# Patient Record
Sex: Male | Born: 1960 | Race: White | Hispanic: No | Marital: Married | Smoking: Current every day smoker
Health system: Southern US, Community
[De-identification: ages and names within clinical notes are randomized; demographics above are authoritative.]

## PROBLEM LIST (undated history)

## (undated) DIAGNOSIS — J449 Chronic obstructive pulmonary disease, unspecified: Secondary | ICD-10-CM

## (undated) DIAGNOSIS — G43909 Migraine, unspecified, not intractable, without status migrainosus: Secondary | ICD-10-CM

## (undated) DIAGNOSIS — F419 Anxiety disorder, unspecified: Secondary | ICD-10-CM

## (undated) DIAGNOSIS — S46812A Strain of other muscles, fascia and tendons at shoulder and upper arm level, left arm, initial encounter: Secondary | ICD-10-CM

## (undated) DIAGNOSIS — K219 Gastro-esophageal reflux disease without esophagitis: Secondary | ICD-10-CM

## (undated) DIAGNOSIS — M1990 Unspecified osteoarthritis, unspecified site: Secondary | ICD-10-CM

## (undated) DIAGNOSIS — I10 Essential (primary) hypertension: Secondary | ICD-10-CM

## (undated) DIAGNOSIS — Z9989 Dependence on other enabling machines and devices: Secondary | ICD-10-CM

## (undated) DIAGNOSIS — F32A Depression, unspecified: Secondary | ICD-10-CM

## (undated) DIAGNOSIS — G4730 Sleep apnea, unspecified: Secondary | ICD-10-CM

## (undated) HISTORY — PX: JOINT REPLACEMENT: SHX530

## (undated) HISTORY — DX: Gastro-esophageal reflux disease without esophagitis: K21.9

## (undated) HISTORY — DX: Migraine, unspecified, not intractable, without status migrainosus: G43.909

## (undated) HISTORY — DX: Dependence on other enabling machines and devices: Z99.89

## (undated) HISTORY — DX: Depression, unspecified: F32.A

## (undated) HISTORY — DX: Chronic obstructive pulmonary disease, unspecified: J44.9

## (undated) HISTORY — PX: OTHER SURGICAL HISTORY: SHX169

## (undated) HISTORY — DX: Anxiety disorder, unspecified: F41.9

## (undated) HISTORY — DX: Unspecified osteoarthritis, unspecified site: M19.90

---
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
## 1994-01-23 DIAGNOSIS — G4730 Sleep apnea, unspecified: Secondary | ICD-10-CM

## 1994-01-23 HISTORY — PX: OTHER SURGICAL HISTORY: SHX169

## 1994-01-23 HISTORY — DX: Sleep apnea, unspecified: G47.30

## 1997-05-14 ENCOUNTER — Ambulatory Visit: Admission: RE | Admit: 1997-05-14 | Discharge: 1997-05-14 | Payer: Self-pay

## 2005-08-07 ENCOUNTER — Ambulatory Visit: Payer: Self-pay | Admitting: Cardiology

## 2005-08-07 ENCOUNTER — Observation Stay (HOSPITAL_COMMUNITY): Admission: EM | Admit: 2005-08-07 | Discharge: 2005-08-08 | Payer: Self-pay | Admitting: Cardiology

## 2006-12-11 ENCOUNTER — Ambulatory Visit: Payer: Self-pay | Admitting: Cardiology

## 2008-01-24 HISTORY — PX: CARDIAC CATHETERIZATION: SHX172

## 2010-06-07 NOTE — Assessment & Plan Note (Signed)
[facility]                          [facility] NOTE   NAME:[name], [name] KITCHINGS                    MRN:          [id number]  DATE:12/11/2006                            DOB:          22-Mar-1960    CARDIOLOGIST:  Learta Codding, MD,FACC   PRIMARY CARE PHYSICIAN:  None.  He goes to [facility] in [place] with  health issues.   REASON FOR VISIT:  A 7-month followup.   HISTORY OF PRESENT ILLNESS:  [name] [name] is a 50 year old male patient  initially seen by Dr. Simona Huh in July 2007 at [facility]  secondary to chest discomfort and new onset pedal edema.  The patient  was apparently told that he had congestive heart failure and was placed  on Lasix.  The patient underwent a stress Cardiolite which was abnormal.  He also underwent an echocardiogram.  His ejection fraction was normal  with an EF of 55-60%, and he also had normal diastolic function.  He had  no significant valvular abnormalities.  His stress test was abnormal and  he was transferred to [facility] for cardiac catheterization.  This revealed minimal cardiac plaquing.  Specifically, he had 20% distal  LAD stenosis and 20% distal circumflex stenosis and no significant  disease in RCA.  He was lost to followup and recently contacted our  office for a follow up visit.  He returns today for follow up.   The patient notes that he has had no chest pain or dyspnea on exertion.  He describes NYHA class I-II symptoms.  Denies any orthopnea, PND or  pedal edema.  He recently came off of the Lasix and has had no  recurrence of his edema.  He does have sleep apnea, and he is currently  noncompliant with CPAP.  He does, however, note some neck pain off and  on for the last three months.  It is left sided and changes with  positioning changes.  He does have bilateral hand numbness that is  unrelated.  His right is worse than his left.  His numbness is in the  distribution of  the median nerve bilaterally.   In reviewing his history, he had sudden onset of pedal edema a little  over a year ago that prompted his extensive workup.  He had no other  symptoms of congestive heart failure.  He was apparently told he had  edema on a chest x-ray.  However, he never really had significant  dyspnea.  Based upon his normal findings on his echocardiogram and his  heart catheterization, congestive heart failure seems to be an unlikely  diagnosis.   The patient does note quite a bit of fatigue.  He has been noncompliant  with his CPAP.  His wife notes that he still snores and still has  witnessed apneic episodes.   CURRENT MEDICATIONS:  He is currently out of aspirin, Lasix and is not  using a CPAP.   ALLERGIES:  HYDROCODONE.   SOCIAL HISTORY:  The patient smokes between one and one half packs of  cigarettes per day.   FAMILY HISTORY:  Insignificant for premature coronary artery disease.   PHYSICAL EXAMINATION:  GENERAL:  He is a well-developed, well-nourished  male in no distress.  VITAL SIGNS:  Blood pressure 120/78, pulse 81, weight 190.4 pounds .  HEENT:  Normal.  NECK:  Without JVD, lymphadenopathy.  ENDOCRINE:  Without thyromegaly.  CARDIAC:  Normal S1, S2.  Regular rate and rhythm without murmurs.  LUNGS:  Clear to auscultation bilaterally without wheezes, rales or  rhonchi.  ABDOMEN:  Soft, nontender with normoactive bowel sounds.  No  organomegaly  EXTREMITIES:  Without edema.  Calves are soft and nontender.  SKIN:  Warm and dry.  NEUROLOGICAL:  Alert and oriented x3.  Cranial nerves II-XII are grossly  intact.  VASCULAR:  Right femoral arteriotomy site without hematoma or bruit.  Dorsalis pedis and posterior tibialis pulses are 2+ bilaterally.   STUDIES:  Electrocardiogram reveals sinus rhythm with a heart rate of  66, normal axis, no acute changes.   IMPRESSION:  1. Minimal coronary plaquing by cardiac catheterization July 2007.  2. Good LV  function with normal diastolic function by echocardiogram.  3. Tobacco abuse.  4. Sleep apnea.  CPAP noncompliant.  5. Questionable history of hypertension.  The patient is normotensive      today.  6. Past history of transient pedal edema - etiology unclear.  Suspect      venous insufficiency.  7. Neck pain.  8. Probable bilateral carpal tunnel syndrome.   PLAN:  As noted above, the patient returns to the office today for  follow up.  The exact etiology of his pedal edema a year and a half ago  is unclear.  I do not think he had congestive heart failure.  His  diastolic function was normal on echocardiogram and systolic function  was also normal.  He has been quite concerned over the last year and a  half about his cardiac diagnoses.  I have reassured him today that he is  doing well.  He is noncompliant with his CPAP, and I have encouraged him  to continue with this.  I have also encouraged him to quit smoking.  He  does need to establish with a primary care physician.  His neck pain is  probably musculoskeletal.   At this point in time, I have recommended:  1. Discontinue Lasix.  2. Continue on aspirin 81 mg daily for primary prevention.  3. Will arrange lipid evaluation.  We will also check other laboratory      data because of his history of fatigue.  This will include a C-met,      CBC and TSH.  4. Will also set him up for cervical spine x-ray to rule out acute      pathology.  I have encouraged him to follow with the primary care      physician further for this.  I have also recommended Motrin over-      the-counter three times a day for a week, and then he is to      discontinue.  5. He did quit smoking.  6. We will refer him to Dr. Ninetta Lights for follow up on his sleep apnea.  7. He can return for routine follow up in 12 months or sooner p.r.n.      Tereso Newcomer, PA-C  Electronically Signed      Learta Codding, MD,FACC  Electronically Signed   SW/MedQ  DD:  12/11/2006  DT: 12/12/2006  Job #: [id number]

## 2010-06-10 NOTE — Cardiovascular Report (Signed)
NAMEJOHNPATRICK, JENNY NO.:  [id number]   MEDICAL RECORD NO.:  [id number]          PATIENT TYPE:  INP   LOCATION:  4714                         FACILITY:  [facility]   PHYSICIAN:  Jonelle Sidle, M.D. LHCDATE OF BIRTH:  [birth year]   DATE OF PROCEDURE:  DATE OF DISCHARGE:                              CARDIAC CATHETERIZATION   REQUESTING CARDIOLOGIST:  Jonelle Sidle, MD.   INDICATIONS:  Mr. Deringer is a 50 year old male with a history of tobacco  use and obstructive sleep apnea.  He underwent cardiac catheterization back  in 2001 at a facility in [place], [place], demonstrating minor coronary  plaque following an abnormal stress test.  He was recently admitted to  [facility] with recurrent chest pain and ruled out for  myocardial infarction.  He was referred for an exercise Cardiolite study,  which was abnormal, demonstrating possible inferior scar with peri-infarct  ischemia with nonspecific ST segment changes.  The potential risks and  benefits of diagnostic coronary angiography were discussed with him and he  has agreed to proceed.  Informed consent was obtained.   PROCEDURES PERFORMED:  1.  Left heart catheterization.  2.  Selective coronary angiography.  3.  Left ventriculography.   ACCESS AND EQUIPMENT:  The area about the right femoral artery was  anesthetized with 1% lidocaine and a 6-French sheath was placed in the right  femoral artery via the modified Seldinger technique.  Standard preformed 6-  [place] and JR4 catheters were used for selective coronary angiography  and an angled pigtail catheter was used for left heart catheterization and  left ventriculography.  All exchanges were made over a wire.  A total 100 mL  of Omnipaque were used.  The patient tolerated the procedure well without  immediate complications.   HEMODYNAMIC RESULTS:  Aorta 117/73 mmHg.  Left ventricle 118/22 mmHg.   ANGIOGRAPHIC FINDINGS:  1.  The  left main coronary artery is relatively short and gives rise to the      left anterior descending and circumflex coronary arteries.  There is no      significant flow-limiting coronary atherosclerosis noted.  2.  The left anterior descending is a medium-caliber vessel with 2 branching      diagonals in the proximal vessel.  Minor luminal irregularities are      noted including approximately 20% distal left anterior descending      stenosis.  No flow-limiting stenoses are noted.  3.  The circumflex coronary artery is a medium-caliber vessel with 2 obtuse      marginal branches.  There is approximately 20% stenosis in the distal      circumflex, but no other major flow-limiting stenoses are noted.  4.  The right coronary artery is a medium-to-large-caliber dominant vessel.      No significant flow-limiting coronary atherosclerosis is noted within      this vessel.    Left ventriculography was performed in the RAO projection and reveals an  ejection fraction of approximately 50% to 55% without focal anterior or  inferior wall motion abnormality and with 1+ mitral regurgitation.  DIAGNOSES:  1.  Minor coronary atherosclerosis as outlined without flow-limiting      stenoses within the major epicardial vessels.  2.  Left ventricular ejection fraction of approximately 50% to 55% without      focal anterior or inferior wall motion abnormality, 1+ mitral      regurgitation and a left ventricular end-diastolic pressure of 22 mmHg.   DISCUSSION:  I reviewed the results with the patient and his family.  He  does not have obstructive coronary artery disease and therefore likely had a  false-positive Cardiolite results.  At this point, the plan will be for  aggressive risk factor modification.  He has already stopped smoking within  the last 6 weeks.  I have also encouraged him to continue therapy for his  known obstructive sleep apnea with CPAP therapy.  He will have followup in  our [facility] office  for a groin check.      Jonelle Sidle, M.D. [facility]  Electronically Signed     SGM/MEDQ  D:  08/08/2005  T:  08/08/2005  Job:  [id number]   cc:   Donzetta Sprung  Fax: [phone]

## 2010-06-10 NOTE — Discharge Summary (Signed)
NAMEANDRON, MARRAZZO             ACCOUNT NO.:  [id number]   MEDICAL RECORD NO.:  [id number]          PATIENT TYPE:  INP   LOCATION:  4714                         FACILITY:  [facility]   PHYSICIAN:  Jonelle Sidle, M.D. LHCDATE OF BIRTH:  02-03-1960   DATE OF ADMISSION:  08/07/2005  DATE OF DISCHARGE:  08/08/2005                                 DISCHARGE SUMMARY   PROCEDURES:  1.  Cardiac catheterization.  2.  Coronary arteriogram.  3.  Left ventriculogram.   PRIMARY DIAGNOSIS:  1.  Substernal chest pain, status post exercise stress test showing a      moderate size partially reversible inferior defect and with cardiac      catheterization showing a distal 20% stenosis in the left anterior      descending and circumflex as the only lesion, an ejection fraction of      55%.  2.  Borderline hypertension with systolic blood pressure between 110 and 130      on Lasix 20 mg a day.  3.  Mild dyslipidemia with total cholesterol 123, triglycerides 127, HDL of      25, and LDL 73 this admission.  4.  Ongoing tobacco use.  5.  Sleep apnea.  6.  Status post left orchiopexy, appendectomy and septoplasty.  7.  Status post left elbow and left femur surgery in 1994.  8.  Allergy or intolerance to HYDROCODONE.   HOSPITAL COURSE:  Mr. Rylee is a 50 year old male with cardiac risk  factors including ongoing tobacco use and mild hypertension who developed  chest pain and came to [facility].  He was treated with aspirin and  nitro paste and his symptoms resolved.  He was evaluated there by cardiology  and a stress test was abnormal with a partially reversible inferior defect.  His EF was also listed at 49% although an echocardiogram listed his EF at 50-  55%.  He was transferred to [facility] for further evaluation and  catheterization.   The catheterization showed no obstructive coronary artery disease.  His EF  was 50-55% with no wall motion abnormalities.  He had 1+ MR and  left  ventricular end diastolic pressure of 22.  Dr. Diona Browner evaluated the films  and felt that the stress test was false positive.  He recommended risk  factor modification and to continue on CPAP for sleep apnea.  He is to  follow up in the [place] office.  Mr. Thune was considered stable for  discharge on August 08, 2005, with outpatient follow-up arranged.   DISCHARGE INSTRUCTIONS:  His activity level is to be increased gradually.  He is cleared to return to work next week.  He is to call our office for  problems with the cath site.  He is to stick to a low fat diet.  Smoking  cessation was encouraged.   DISCHARGE MEDICATIONS:  1.  Lasix 20 mg q.d. (prescription given).  2.  Baby aspirin 81 mg q. day.      Theodore Demark, P.A. [facility]      Jonelle Sidle, M.D. [facility]  Electronically Signed  RB/MEDQ  D:  08/08/2005  T:  08/08/2005  Job:  [id number]   cc:   Donzetta Sprung  Fax: [phone]

## 2018-08-28 ENCOUNTER — Encounter (HOSPITAL_BASED_OUTPATIENT_CLINIC_OR_DEPARTMENT_OTHER): Payer: Self-pay | Admitting: *Deleted

## 2018-08-28 ENCOUNTER — Other Ambulatory Visit: Payer: Self-pay

## 2018-09-02 ENCOUNTER — Encounter (HOSPITAL_BASED_OUTPATIENT_CLINIC_OR_DEPARTMENT_OTHER)
Admission: RE | Admit: 2018-09-02 | Discharge: 2018-09-02 | Disposition: A | Discharge status: Home / Self Care | Payer: 59 | Source: Ambulatory Visit | Attending: Orthopedic Surgery | Admitting: Orthopedic Surgery

## 2018-09-02 ENCOUNTER — Other Ambulatory Visit: Payer: Self-pay

## 2018-09-02 ENCOUNTER — Other Ambulatory Visit (HOSPITAL_COMMUNITY)
Admission: RE | Admit: 2018-09-02 | Discharge: 2018-09-02 | Disposition: A | Discharge status: Home / Self Care | Payer: 59 | Source: Ambulatory Visit | Attending: Orthopedic Surgery | Admitting: Orthopedic Surgery

## 2018-09-02 DIAGNOSIS — Z01818 Encounter for other preprocedural examination: Secondary | ICD-10-CM | POA: Diagnosis present

## 2018-09-02 DIAGNOSIS — Z20828 Contact with and (suspected) exposure to other viral communicable diseases: Secondary | ICD-10-CM | POA: Insufficient documentation

## 2018-09-02 LAB — SARS CORONAVIRUS 2 (TAT 6-24 HRS): SARS Coronavirus 2: NEGATIVE

## 2018-09-02 NOTE — Progress Notes (Addendum)
      Enhanced Recovery after Surgery for Orthopedics Enhanced Recovery after Surgery is a protocol used to improve the stress on your body and your recovery after surgery.  Patient Instructions  . The night before surgery:  o No food after midnight. ONLY clear liquids after midnight  . The day of surgery (if you do NOT have diabetes):  o Drink ONE (1) Pre-Surgery Clear Ensure as directed.   o This drink was given to you during your hospital  pre-op appointment visit. o The pre-op nurse will instruct you on the time to drink the  Pre-Surgery Ensure depending on your surgery time. o Finish the drink at the designated time by the pre-op nurse.  o Nothing else to drink after completing the  Pre-Surgery Clear Ensure.  . The day of surgery (if you have diabetes): o Drink ONE (1) Gatorade 2 (G2) as directed. o This drink was given to you during your hospital  pre-op appointment visit.  o The pre-op nurse will instruct you on the time to drink the   Gatorade 2 (G2) depending on your surgery time. o Color of the Gatorade may vary. Red is not allowed. o Nothing else to drink after completing the  Gatorade 2 (G2).         If you have questions, please contact your surgeon's office.  EKG reviewed by Dr. Marcie Bal, will proceed with surgery as scheduled.

## 2018-09-04 NOTE — Anesthesia Preprocedure Evaluation (Addendum)
Anesthesia Evaluation  Patient identified by MRN, date of birth, ID band Patient awake    Reviewed: Allergy & Precautions, H&P , NPO status , Patient's Chart, lab work & pertinent test results  Airway Mallampati: II  TM Distance: >3 FB Neck ROM: Full    Dental no notable dental hx. (+) Poor Dentition, Chipped, Missing, Dental Advisory Given   Pulmonary neg pulmonary ROS, sleep apnea , Current Smoker,    Pulmonary exam normal breath sounds clear to auscultation       Cardiovascular Exercise Tolerance: Good hypertension, Pt. on medications negative cardio ROS Normal cardiovascular exam Rhythm:Regular Rate:Normal     Neuro/Psych negative neurological ROS  negative psych ROS   GI/Hepatic negative GI ROS, Neg liver ROS,   Endo/Other  negative endocrine ROS  Renal/GU negative Renal ROS  negative genitourinary   Musculoskeletal negative musculoskeletal ROS (+)   Abdominal   Peds negative pediatric ROS (+)  Hematology negative hematology ROS (+)   Anesthesia Other Findings   Reproductive/Obstetrics negative OB ROS                            Anesthesia Physical Anesthesia Plan  ASA: II  Anesthesia Plan: General   Post-op Pain Management: GA combined w/ Regional for post-op pain   Induction:   PONV Risk Score and Plan: 2 and Ondansetron, Treatment may vary due to age or medical condition and Dexamethasone  Airway Management Planned: Oral ETT and LMA  Additional Equipment:   Intra-op Plan:   Post-operative Plan: Extubation in OR  Informed Consent: I have reviewed the patients History and Physical, chart, labs and discussed the procedure including the risks, benefits and alternatives for the proposed anesthesia with the patient or authorized representative who has indicated his/her understanding and acceptance.     Dental advisory given  Plan Discussed with: Anesthesiologist,  Surgeon and CRNA  Anesthesia Plan Comments: (Discussed both nerve block for pain relief post-op and GA; including NV, sore throat, dental injury, and pulmonary complications)       Anesthesia Quick Evaluation

## 2018-09-05 ENCOUNTER — Encounter (HOSPITAL_BASED_OUTPATIENT_CLINIC_OR_DEPARTMENT_OTHER): Payer: Self-pay | Admitting: *Deleted

## 2018-09-05 ENCOUNTER — Encounter (HOSPITAL_COMMUNITY)
Admission: RE | Disposition: A | Discharge status: Home / Self Care | Payer: Self-pay | Source: Home / Self Care | Attending: Emergency Medicine

## 2018-09-05 ENCOUNTER — Ambulatory Visit (HOSPITAL_BASED_OUTPATIENT_CLINIC_OR_DEPARTMENT_OTHER): Payer: 59 | Admitting: Anesthesiology

## 2018-09-05 ENCOUNTER — Observation Stay (HOSPITAL_BASED_OUTPATIENT_CLINIC_OR_DEPARTMENT_OTHER)
Admission: RE | Admit: 2018-09-05 | Discharge: 2018-09-06 | Disposition: A | Discharge status: Home / Self Care | Payer: 59 | Attending: Emergency Medicine | Admitting: Emergency Medicine

## 2018-09-05 ENCOUNTER — Other Ambulatory Visit: Payer: Self-pay

## 2018-09-05 DIAGNOSIS — M75102 Unspecified rotator cuff tear or rupture of left shoulder, not specified as traumatic: Secondary | ICD-10-CM | POA: Insufficient documentation

## 2018-09-05 DIAGNOSIS — R001 Bradycardia, unspecified: Secondary | ICD-10-CM | POA: Insufficient documentation

## 2018-09-05 DIAGNOSIS — K30 Functional dyspepsia: Secondary | ICD-10-CM | POA: Diagnosis present

## 2018-09-05 DIAGNOSIS — S46112A Strain of muscle, fascia and tendon of long head of biceps, left arm, initial encounter: Secondary | ICD-10-CM | POA: Diagnosis not present

## 2018-09-05 DIAGNOSIS — Z20828 Contact with and (suspected) exposure to other viral communicable diseases: Secondary | ICD-10-CM | POA: Diagnosis not present

## 2018-09-05 DIAGNOSIS — G4733 Obstructive sleep apnea (adult) (pediatric): Secondary | ICD-10-CM | POA: Diagnosis not present

## 2018-09-05 DIAGNOSIS — Z96642 Presence of left artificial hip joint: Secondary | ICD-10-CM | POA: Insufficient documentation

## 2018-09-05 DIAGNOSIS — F17210 Nicotine dependence, cigarettes, uncomplicated: Secondary | ICD-10-CM | POA: Insufficient documentation

## 2018-09-05 DIAGNOSIS — D3501 Benign neoplasm of right adrenal gland: Secondary | ICD-10-CM | POA: Diagnosis present

## 2018-09-05 DIAGNOSIS — I10 Essential (primary) hypertension: Secondary | ICD-10-CM | POA: Diagnosis present

## 2018-09-05 DIAGNOSIS — J189 Pneumonia, unspecified organism: Secondary | ICD-10-CM | POA: Diagnosis present

## 2018-09-05 DIAGNOSIS — M94212 Chondromalacia, left shoulder: Secondary | ICD-10-CM | POA: Insufficient documentation

## 2018-09-05 DIAGNOSIS — S43492A Other sprain of left shoulder joint, initial encounter: Secondary | ICD-10-CM | POA: Insufficient documentation

## 2018-09-05 DIAGNOSIS — S46812A Strain of other muscles, fascia and tendons at shoulder and upper arm level, left arm, initial encounter: Secondary | ICD-10-CM | POA: Diagnosis present

## 2018-09-05 DIAGNOSIS — M7542 Impingement syndrome of left shoulder: Secondary | ICD-10-CM | POA: Diagnosis not present

## 2018-09-05 DIAGNOSIS — X58XXXA Exposure to other specified factors, initial encounter: Secondary | ICD-10-CM | POA: Insufficient documentation

## 2018-09-05 DIAGNOSIS — M25512 Pain in left shoulder: Secondary | ICD-10-CM | POA: Diagnosis present

## 2018-09-05 DIAGNOSIS — Z79899 Other long term (current) drug therapy: Secondary | ICD-10-CM | POA: Diagnosis not present

## 2018-09-05 DIAGNOSIS — J449 Chronic obstructive pulmonary disease, unspecified: Secondary | ICD-10-CM | POA: Diagnosis not present

## 2018-09-05 HISTORY — PX: SHOULDER ARTHROSCOPY WITH ROTATOR CUFF REPAIR AND SUBACROMIAL DECOMPRESSION: SHX5686

## 2018-09-05 HISTORY — DX: Essential (primary) hypertension: I10

## 2018-09-05 HISTORY — DX: Strain of other muscles, fascia and tendons at shoulder and upper arm level, left arm, initial encounter: S46.812A

## 2018-09-05 SURGERY — SHOULDER ARTHROSCOPY WITH ROTATOR CUFF REPAIR AND SUBACROMIAL DECOMPRESSION
Anesthesia: General | Site: Shoulder | Laterality: Left

## 2018-09-05 MED ORDER — OXYCODONE HCL 5 MG/5ML PO SOLN: 5.0000 mg | Freq: Once | ORAL | Status: DC | PRN

## 2018-09-05 MED ORDER — ONDANSETRON HCL 4 MG/2ML IJ SOLN
INTRAMUSCULAR | Status: DC | PRN
  Administered 2018-09-05: 4 mg via INTRAVENOUS

## 2018-09-05 MED ORDER — DEXAMETHASONE SODIUM PHOSPHATE 10 MG/ML IJ SOLN
INTRAMUSCULAR | Status: AC
  Filled 2018-09-05: qty 1

## 2018-09-05 MED ORDER — OXYCODONE HCL 5 MG PO TABS: 5.0000 mg | ORAL_TABLET | ORAL | 0 refills | Status: DC | PRN

## 2018-09-05 MED ORDER — BISACODYL 10 MG RE SUPP: 10.0000 mg | Freq: Every day | RECTAL | Status: DC | PRN

## 2018-09-05 MED ORDER — FENTANYL CITRATE (PF) 100 MCG/2ML IJ SOLN: 25.0000 ug | INTRAMUSCULAR | Status: DC | PRN

## 2018-09-05 MED ORDER — LACTATED RINGERS IV SOLN: 100.0000 mL/h | INTRAVENOUS | Status: DC

## 2018-09-05 MED ORDER — LACTATED RINGERS IV SOLN
INTRAVENOUS | Status: DC
  Administered 2018-09-05: 07:00:00 via INTRAVENOUS

## 2018-09-05 MED ORDER — POLYETHYLENE GLYCOL 3350 17 G PO PACK: 17.0000 g | PACK | Freq: Every day | ORAL | Status: DC | PRN

## 2018-09-05 MED ORDER — OXYCODONE HCL 5 MG PO TABS
5.0000 mg | ORAL_TABLET | ORAL | Status: DC | PRN
  Administered 2018-09-05: 10 mg via ORAL
  Filled 2018-09-05: qty 2

## 2018-09-05 MED ORDER — SODIUM CHLORIDE 0.9 % IN NEBU
INHALATION_SOLUTION | RESPIRATORY_TRACT | Status: AC
  Filled 2018-09-05: qty 6

## 2018-09-05 MED ORDER — ACETAMINOPHEN 500 MG PO TABS
1000.0000 mg | ORAL_TABLET | Freq: Once | ORAL | Status: AC
  Administered 2018-09-05: 1000 mg via ORAL

## 2018-09-05 MED ORDER — SUGAMMADEX SODIUM 200 MG/2ML IV SOLN
INTRAVENOUS | Status: DC | PRN
  Administered 2018-09-05: 200 mg via INTRAVENOUS

## 2018-09-05 MED ORDER — SOD CITRATE-CITRIC ACID 500-334 MG/5ML PO SOLN: 30.0000 mL | Freq: Once | ORAL | Status: DC

## 2018-09-05 MED ORDER — MAGNESIUM CITRATE PO SOLN: 1.0000 | Freq: Once | ORAL | Status: DC | PRN

## 2018-09-05 MED ORDER — ROCURONIUM BROMIDE 100 MG/10ML IV SOLN
INTRAVENOUS | Status: DC | PRN
  Administered 2018-09-05: 50 mg via INTRAVENOUS

## 2018-09-05 MED ORDER — OXYCODONE HCL 5 MG PO TABS: 5.0000 mg | ORAL_TABLET | Freq: Once | ORAL | Status: DC | PRN

## 2018-09-05 MED ORDER — LIDOCAINE HCL (CARDIAC) PF 100 MG/5ML IV SOSY
PREFILLED_SYRINGE | INTRAVENOUS | Status: DC | PRN
  Administered 2018-09-05: 100 mg via INTRAVENOUS

## 2018-09-05 MED ORDER — CEFAZOLIN SODIUM-DEXTROSE 2-4 GM/100ML-% IV SOLN
2.0000 g | INTRAVENOUS | Status: AC
  Administered 2018-09-05: 08:00:00 2 g via INTRAVENOUS

## 2018-09-05 MED ORDER — PHENYLEPHRINE HCL (PRESSORS) 10 MG/ML IV SOLN
INTRAVENOUS | Status: DC | PRN
  Administered 2018-09-05: 200 ug via INTRAVENOUS

## 2018-09-05 MED ORDER — LISINOPRIL 20 MG PO TABS: 20.0000 mg | ORAL_TABLET | Freq: Every day | ORAL | Status: DC

## 2018-09-05 MED ORDER — FENTANYL CITRATE (PF) 100 MCG/2ML IJ SOLN
INTRAMUSCULAR | Status: AC
  Filled 2018-09-05: qty 2

## 2018-09-05 MED ORDER — HYDROMORPHONE HCL 1 MG/ML IJ SOLN: 0.5000 mg | INTRAMUSCULAR | Status: DC | PRN

## 2018-09-05 MED ORDER — CEFAZOLIN SODIUM-DEXTROSE 1-4 GM/50ML-% IV SOLN
1.0000 g | Freq: Four times a day (QID) | INTRAVENOUS | Status: AC
  Administered 2018-09-05: 1 g via INTRAVENOUS
  Filled 2018-09-05 (×2): qty 50

## 2018-09-05 MED ORDER — BUPIVACAINE-EPINEPHRINE (PF) 0.5% -1:200000 IJ SOLN
INTRAMUSCULAR | Status: DC | PRN
  Administered 2018-09-05: 25 mL via PERINEURAL

## 2018-09-05 MED ORDER — ALBUTEROL SULFATE (2.5 MG/3ML) 0.083% IN NEBU
2.5000 mg | INHALATION_SOLUTION | Freq: Four times a day (QID) | RESPIRATORY_TRACT | Status: DC | PRN
  Administered 2018-09-05: 2.5 mg via RESPIRATORY_TRACT

## 2018-09-05 MED ORDER — OXYCODONE HCL 5 MG PO TABS: 10.0000 mg | ORAL_TABLET | ORAL | Status: DC | PRN

## 2018-09-05 MED ORDER — MIDAZOLAM HCL 2 MG/2ML IJ SOLN
INTRAMUSCULAR | Status: AC
  Filled 2018-09-05: qty 2

## 2018-09-05 MED ORDER — CEFAZOLIN SODIUM-DEXTROSE 2-4 GM/100ML-% IV SOLN
INTRAVENOUS | Status: AC
  Filled 2018-09-05: qty 100

## 2018-09-05 MED ORDER — SENNA 8.6 MG PO TABS
1.0000 | ORAL_TABLET | Freq: Two times a day (BID) | ORAL | Status: DC
  Administered 2018-09-05: 8.6 mg via ORAL
  Filled 2018-09-05: qty 1

## 2018-09-05 MED ORDER — ALBUTEROL SULFATE (2.5 MG/3ML) 0.083% IN NEBU
INHALATION_SOLUTION | RESPIRATORY_TRACT | Status: AC
  Filled 2018-09-05: qty 3

## 2018-09-05 MED ORDER — ONDANSETRON HCL 4 MG PO TABS: 4.0000 mg | ORAL_TABLET | Freq: Four times a day (QID) | ORAL | Status: DC | PRN

## 2018-09-05 MED ORDER — BUPIVACAINE LIPOSOME 1.3 % IJ SUSP
INTRAMUSCULAR | Status: DC | PRN
  Administered 2018-09-05: 10 mL via PERINEURAL

## 2018-09-05 MED ORDER — ONDANSETRON HCL 4 MG PO TABS: 4.0000 mg | ORAL_TABLET | Freq: Three times a day (TID) | ORAL | 0 refills | Status: DC | PRN

## 2018-09-05 MED ORDER — ACETAMINOPHEN 500 MG PO TABS
ORAL_TABLET | ORAL | Status: AC
  Filled 2018-09-05: qty 2

## 2018-09-05 MED ORDER — METHOCARBAMOL 1000 MG/10ML IJ SOLN
500.0000 mg | Freq: Four times a day (QID) | INTRAVENOUS | Status: DC | PRN
  Filled 2018-09-05 (×2): qty 5

## 2018-09-05 MED ORDER — LIDOCAINE 2% (20 MG/ML) 5 ML SYRINGE
INTRAMUSCULAR | Status: AC
  Filled 2018-09-05: qty 5

## 2018-09-05 MED ORDER — ACETAMINOPHEN 500 MG PO TABS
1000.0000 mg | ORAL_TABLET | Freq: Four times a day (QID) | ORAL | Status: DC
  Administered 2018-09-05 (×2): 1000 mg via ORAL
  Filled 2018-09-05 (×2): qty 2

## 2018-09-05 MED ORDER — DOCUSATE SODIUM 100 MG PO CAPS
100.0000 mg | ORAL_CAPSULE | Freq: Two times a day (BID) | ORAL | Status: DC
  Administered 2018-09-05: 100 mg via ORAL
  Filled 2018-09-05: qty 1

## 2018-09-05 MED ORDER — ONDANSETRON HCL 4 MG/2ML IJ SOLN
INTRAMUSCULAR | Status: AC
  Filled 2018-09-05: qty 2

## 2018-09-05 MED ORDER — SODIUM CHLORIDE 0.9 % IR SOLN
Status: DC | PRN
  Administered 2018-09-05: 6000 mL

## 2018-09-05 MED ORDER — PROPOFOL 10 MG/ML IV BOLUS
INTRAVENOUS | Status: DC | PRN
  Administered 2018-09-05: 200 mg via INTRAVENOUS

## 2018-09-05 MED ORDER — SODIUM CHLORIDE 0.9 % IV SOLN
INTRAVENOUS | Status: DC
  Administered 2018-09-05: 12:00:00 via INTRAVENOUS

## 2018-09-05 MED ORDER — GABAPENTIN 300 MG PO CAPS
300.0000 mg | ORAL_CAPSULE | Freq: Three times a day (TID) | ORAL | Status: DC
  Administered 2018-09-05: 300 mg via ORAL
  Filled 2018-09-05: qty 1

## 2018-09-05 MED ORDER — PROPOFOL 10 MG/ML IV BOLUS
INTRAVENOUS | Status: AC
  Filled 2018-09-05: qty 40

## 2018-09-05 MED ORDER — EPHEDRINE SULFATE 50 MG/ML IJ SOLN
INTRAMUSCULAR | Status: DC | PRN
  Administered 2018-09-05: 10 mg via INTRAVENOUS
  Administered 2018-09-05: 15 mg via INTRAVENOUS

## 2018-09-05 MED ORDER — ONDANSETRON HCL 4 MG/2ML IJ SOLN: 4.0000 mg | Freq: Once | INTRAMUSCULAR | Status: DC | PRN

## 2018-09-05 MED ORDER — SENNA-DOCUSATE SODIUM 8.6-50 MG PO TABS: 2.0000 | ORAL_TABLET | Freq: Every day | ORAL | 1 refills | Status: DC

## 2018-09-05 MED ORDER — ONDANSETRON HCL 4 MG/2ML IJ SOLN: 4.0000 mg | Freq: Four times a day (QID) | INTRAMUSCULAR | Status: DC | PRN

## 2018-09-05 MED ORDER — MIDAZOLAM HCL 2 MG/2ML IJ SOLN
1.0000 mg | INTRAMUSCULAR | Status: DC | PRN
  Administered 2018-09-05: 2 mg via INTRAVENOUS

## 2018-09-05 MED ORDER — MEPERIDINE HCL 25 MG/ML IJ SOLN: 6.2500 mg | INTRAMUSCULAR | Status: DC | PRN

## 2018-09-05 MED ORDER — ROCURONIUM BROMIDE 10 MG/ML (PF) SYRINGE
PREFILLED_SYRINGE | INTRAVENOUS | Status: AC
  Filled 2018-09-05: qty 10

## 2018-09-05 MED ORDER — METOCLOPRAMIDE HCL 5 MG PO TABS
5.0000 mg | ORAL_TABLET | Freq: Three times a day (TID) | ORAL | Status: DC | PRN
  Filled 2018-09-05: qty 2

## 2018-09-05 MED ORDER — CHLORHEXIDINE GLUCONATE 4 % EX LIQD: 60.0000 mL | Freq: Once | CUTANEOUS | Status: DC

## 2018-09-05 MED ORDER — ACETAMINOPHEN 325 MG PO TABS: 325.0000 mg | ORAL_TABLET | ORAL | Status: DC | PRN

## 2018-09-05 MED ORDER — ACETAMINOPHEN 160 MG/5ML PO SOLN: 325.0000 mg | ORAL | Status: DC | PRN

## 2018-09-05 MED ORDER — ACETAMINOPHEN 325 MG PO TABS: 325.0000 mg | ORAL_TABLET | Freq: Four times a day (QID) | ORAL | Status: DC | PRN

## 2018-09-05 MED ORDER — DEXAMETHASONE SODIUM PHOSPHATE 4 MG/ML IJ SOLN
INTRAMUSCULAR | Status: DC | PRN
  Administered 2018-09-05: 10 mg via INTRAVENOUS

## 2018-09-05 MED ORDER — FENTANYL CITRATE (PF) 100 MCG/2ML IJ SOLN
100.0000 ug | INTRAMUSCULAR | Status: DC | PRN
  Administered 2018-09-05: 100 ug via INTRAVENOUS

## 2018-09-05 MED ORDER — METOCLOPRAMIDE HCL 5 MG/ML IJ SOLN: 5.0000 mg | Freq: Three times a day (TID) | INTRAMUSCULAR | Status: DC | PRN

## 2018-09-05 MED ORDER — ZOLPIDEM TARTRATE 5 MG PO TABS: 5.0000 mg | ORAL_TABLET | Freq: Every evening | ORAL | Status: DC | PRN

## 2018-09-05 MED ORDER — RACEPINEPHRINE HCL 2.25 % IN NEBU
INHALATION_SOLUTION | RESPIRATORY_TRACT | Status: AC
  Filled 2018-09-05: qty 0.5

## 2018-09-05 MED ORDER — METHOCARBAMOL 500 MG PO TABS
500.0000 mg | ORAL_TABLET | Freq: Four times a day (QID) | ORAL | Status: DC | PRN
  Administered 2018-09-05 (×2): 500 mg via ORAL
  Filled 2018-09-05 (×2): qty 1

## 2018-09-05 MED ORDER — TIZANIDINE HCL 4 MG PO TABS: 4.0000 mg | ORAL_TABLET | Freq: Three times a day (TID) | ORAL | Status: DC

## 2018-09-05 SURGICAL SUPPLY — 69 items
ANCH SUT SWLK 19.1X4.75 (Anchor) ×1 IMPLANT
ANCHOR SUT BIO SW 4.75X19.1 (Anchor) ×2 IMPLANT
BLADE EXCALIBUR 4.0MM X 13CM (MISCELLANEOUS)
BLADE EXCALIBUR 4.0X13 (MISCELLANEOUS) IMPLANT
BLADE SURG 15 STRL LF DISP TIS (BLADE) IMPLANT
BLADE SURG 15 STRL SS (BLADE)
BURR OVAL 8 FLU 5.0MM X 13CM (MISCELLANEOUS)
BURR OVAL 8 FLU 5.0X13 (MISCELLANEOUS) IMPLANT
CANNULA 5.75X71 LONG (CANNULA) ×5 IMPLANT
CANNULA TWIST IN 8.25X7CM (CANNULA) ×2 IMPLANT
CLOSURE STERI-STRIP 1/2X4 (GAUZE/BANDAGES/DRESSINGS) ×1
CLSR STERI-STRIP ANTIMIC 1/2X4 (GAUZE/BANDAGES/DRESSINGS) ×2 IMPLANT
COVER WAND RF STERILE (DRAPES) IMPLANT
DECANTER SPIKE VIAL GLASS SM (MISCELLANEOUS) IMPLANT
DISSECTOR  3.8MM X 13CM (MISCELLANEOUS) ×2
DISSECTOR 3.8MM X 13CM (MISCELLANEOUS) ×1 IMPLANT
DRAPE HALF SHEET 70X43 (DRAPES) ×3 IMPLANT
DRAPE IMP U-DRAPE 54X76 (DRAPES) ×3 IMPLANT
DRAPE INCISE IOBAN 66X45 STRL (DRAPES) ×3 IMPLANT
DRAPE SHOULDER BEACH CHAIR (DRAPES) ×3 IMPLANT
DRAPE U-SHAPE 47X51 STRL (DRAPES) ×3 IMPLANT
DRSG PAD ABDOMINAL 8X10 ST (GAUZE/BANDAGES/DRESSINGS) ×3 IMPLANT
DURAPREP 26ML APPLICATOR (WOUND CARE) ×3 IMPLANT
ELECT REM PT RETURN 9FT ADLT (ELECTROSURGICAL)
ELECTRODE REM PT RTRN 9FT ADLT (ELECTROSURGICAL) IMPLANT
FIBERSTICK 2 (SUTURE) IMPLANT
GAUZE SPONGE 4X4 12PLY STRL (GAUZE/BANDAGES/DRESSINGS) ×3 IMPLANT
GLOVE BIO SURGEON STRL SZ8 (GLOVE) ×3 IMPLANT
GLOVE BIOGEL PI IND STRL 7.0 (GLOVE) IMPLANT
GLOVE BIOGEL PI IND STRL 8 (GLOVE) ×2 IMPLANT
GLOVE BIOGEL PI INDICATOR 7.0 (GLOVE) ×2
GLOVE BIOGEL PI INDICATOR 8 (GLOVE) ×6
GLOVE ECLIPSE 6.5 STRL STRAW (GLOVE) ×2 IMPLANT
GLOVE ORTHO TXT STRL SZ7.5 (GLOVE) ×5 IMPLANT
GOWN STRL REUS W/ TWL LRG LVL3 (GOWN DISPOSABLE) ×1 IMPLANT
GOWN STRL REUS W/ TWL XL LVL3 (GOWN DISPOSABLE) ×2 IMPLANT
GOWN STRL REUS W/TWL LRG LVL3 (GOWN DISPOSABLE) ×3
GOWN STRL REUS W/TWL XL LVL3 (GOWN DISPOSABLE) ×6
IMMOBILIZER SHOULDER FOAM XLGE (SOFTGOODS) IMPLANT
LASSO 90 CVE QUICKPAS (DISPOSABLE) ×2 IMPLANT
MANIFOLD NEPTUNE II (INSTRUMENTS) ×3 IMPLANT
NDL SCORPION MULTI FIRE (NEEDLE) IMPLANT
NEEDLE SCORPION MULTI FIRE (NEEDLE) IMPLANT
PACK ARTHROSCOPY DSU (CUSTOM PROCEDURE TRAY) ×3 IMPLANT
PACK BASIN DAY SURGERY FS (CUSTOM PROCEDURE TRAY) ×3 IMPLANT
PORT APPOLLO RF 90DEGREE MULTI (SURGICAL WAND) ×3 IMPLANT
SLEEVE SCD COMPRESS KNEE MED (MISCELLANEOUS) ×3 IMPLANT
SLING ARM FOAM STRAP LRG (SOFTGOODS) ×2 IMPLANT
SLING ARM IMMOBILIZER LRG (SOFTGOODS) IMPLANT
SLING ARM IMMOBILIZER MED (SOFTGOODS) IMPLANT
SLING ARM MED ADULT FOAM STRAP (SOFTGOODS) IMPLANT
SLING ARM XL FOAM STRAP (SOFTGOODS) IMPLANT
SUPPORT WRAP ARM LG (MISCELLANEOUS) ×3 IMPLANT
SUT FIBERWIRE #2 38 T-5 BLUE (SUTURE)
SUT MNCRL AB 4-0 PS2 18 (SUTURE) ×3 IMPLANT
SUT PDS AB 1 CT  36 (SUTURE) ×2
SUT PDS AB 1 CT 36 (SUTURE) IMPLANT
SUT TIGER TAPE 7 IN WHITE (SUTURE) ×2 IMPLANT
SUT VIC AB 3-0 SH 27 (SUTURE)
SUT VIC AB 3-0 SH 27X BRD (SUTURE) IMPLANT
SUTURE FIBERWR #2 38 T-5 BLUE (SUTURE) IMPLANT
SUTURE TAPE 1.3 40 TPR END (SUTURE) IMPLANT
SUTURETAPE 1.3 40 TPR END (SUTURE) ×3
TAPE FIBER 2MM 7IN #2 BLUE (SUTURE) IMPLANT
TOWEL GREEN STERILE FF (TOWEL DISPOSABLE) ×3 IMPLANT
TUBE CONNECTING 20'X1/4 (TUBING) ×1
TUBE CONNECTING 20X1/4 (TUBING) ×1 IMPLANT
TUBING ARTHROSCOPY IRRIG 16FT (MISCELLANEOUS) ×3 IMPLANT
WATER STERILE IRR 1000ML POUR (IV SOLUTION) ×3 IMPLANT

## 2018-09-05 NOTE — Discharge Instructions (Addendum)
Diet: As you were doing prior to hospitalization   Shower:  May shower but keep the wounds dry, use an occlusive plastic wrap, NO SOAKING IN TUB.  If the bandage gets wet, change with a clean dry gauze.  If you have a splint on, leave the splint in place and keep the splint dry with a plastic bag.  Dressing:  You may change your dressing 3-5 days after surgery, unless you have a splint.  If you have a splint, then just leave the splint in place and we will change your bandages during your first follow-up appointment.    If you had hand or foot surgery, we will plan to remove your stitches in about 2 weeks in the office.  For all other surgeries, there are sticky tapes (steri-strips) on your wounds and all the stitches are absorbable.  Leave the steri-strips in place when changing your dressings, they will peel off with time, usually 2-3 weeks.  Activity:  Increase activity slowly as tolerated, but follow the weight bearing instructions below.  The rules on driving is that you can not be taking narcotics while you drive, and you must feel in control of the vehicle.    Weight Bearing:   Sling at all times except hygiene.    To prevent constipation: you may use a stool softener such as -  Colace (over the counter) 100 mg by mouth twice a day  Drink plenty of fluids (prune juice may be helpful) and high fiber foods Miralax (over the counter) for constipation as needed.    Itching:  If you experience itching with your medications, try taking only a single pain pill, or even half a pain pill at a time.  You may take up to 10 pain pills per day, and you can also use benadryl over the counter for itching or also to help with sleep.   Precautions:  If you experience chest pain or shortness of breath - call 911 immediately for transfer to the hospital emergency department!!  If you develop a fever greater that 101 F, purulent drainage from wound, increased redness or drainage from wound, or calf pain --  Call the office at [phone]                                                Follow- Up Appointment:  Please call for an appointment to be seen in 2 weeks [place] - [phone]    Post Anesthesia Home Care Instructions  Activity: Get plenty of rest for the remainder of the day. A responsible individual must stay with you for 24 hours following the procedure.  For the next 24 hours, DO NOT: -Drive a car -Paediatric nurse -Drink alcoholic beverages -Take any medication unless instructed by your physician -Make any legal decisions or sign important papers.  Meals: Start with liquid foods such as gelatin or soup. Progress to regular foods as tolerated. Avoid greasy, spicy, heavy foods. If nausea and/or vomiting occur, drink only clear liquids until the nausea and/or vomiting subsides. Call your physician if vomiting continues.  Special Instructions/Symptoms: Your throat may feel dry or sore from the anesthesia or the breathing tube placed in your throat during surgery. If this causes discomfort, gargle with warm salt water. The discomfort should disappear within 24 hours.  If you had a scopolamine patch placed behind your ear for  the management of post- operative nausea and/or vomiting:  1. The medication in the patch is effective for 72 hours, after which it should be removed.  Wrap patch in a tissue and discard in the trash. Wash hands thoroughly with soap and water. 2. You may remove the patch earlier than 72 hours if you experience unpleasant side effects which may include dry mouth, dizziness or visual disturbances. 3. Avoid touching the patch. Wash your hands with soap and water after contact with the patch.      Regional Anesthesia Blocks  1. Numbness or the inability to move the "blocked" extremity may last from 3-48 hours after placement. The length of time depends on the medication injected and your individual response to the medication. If the numbness is not going away  after 48 hours, call your surgeon.  2. The extremity that is blocked will need to be protected until the numbness is gone and the  Strength has returned. Because you cannot feel it, you will need to take extra care to avoid injury. Because it may be weak, you may have difficulty moving it or using it. You may not know what position it is in without looking at it while the block is in effect.  3. For blocks in the legs and feet, returning to weight bearing and walking needs to be done carefully. You will need to wait until the numbness is entirely gone and the strength has returned. You should be able to move your leg and foot normally before you try and bear weight or walk. You will need someone to be with you when you first try to ensure you do not fall and possibly risk injury.  4. Bruising and tenderness at the needle site are common side effects and will resolve in a few days.  5. Persistent numbness or new problems with movement should be communicated to the surgeon or the [facility] [phone]).    Information for Discharge Teaching: EXPAREL (bupivacaine liposome injectable suspension)   Your surgeon or anesthesiologist gave you EXPAREL(bupivacaine) to help control your pain after surgery.  EXPAREL is a local anesthetic that provides pain relief by numbing the tissue around the surgical site. EXPAREL is designed to release pain medication over time and can control pain for up to 72 hours. Depending on how you respond to EXPAREL, you may require less pain medication during your recovery.  Possible side effects: Temporary loss of sensation or ability to move in the area where bupivacaine was injected. Nausea, vomiting, constipation Rarely, numbness and tingling in your mouth or lips, lightheadedness, or anxiety may occur. Call your doctor right away if you think you may be experiencing any of these sensations, or if you have other  questions regarding possible side effects.  Follow all other discharge instructions given to you by your surgeon or nurse. Eat a healthy diet and drink plenty of water or other fluids.  If you return to the hospital for any reason within 96 hours following the administration of EXPAREL, it is important for health care providers to know that you have received this anesthetic. A teal colored band has been placed on your arm with the date, time and amount of EXPAREL you have received in order to alert and inform your health care providers. Please leave this armband in place for the full 96 hours following administration, and then you may remove the band.

## 2018-09-05 NOTE — Transfer of Care (Signed)
Immediate Anesthesia Transfer of Care Note  Patient: [name] [name]  Procedure(s) Performed: LEFT SHOULDER ARTHROSCOPY WITH SUBSCAPULARIS AND SUBACROMIAL DECOMPRESSION, BICEP TENODESIS, EXTENSIVE DEBRIDEMENT (Left Shoulder)  Patient Location: PACU  Anesthesia Type:General and Regional  Level of Consciousness: awake, alert  and oriented  Airway & Oxygen Therapy: Patient Spontanous Breathing and Patient connected to face mask oxygen  Post-op Assessment: Report given to RN and Post -op Vital signs reviewed and stable  Post vital signs: Reviewed and stable  Last Vitals:  Vitals Value Taken Time  BP 175/114 09/05/18 0920  Temp    Pulse 92 09/05/18 0925  Resp 32 09/05/18 0925  SpO2 100 % 09/05/18 0925  Vitals shown include unvalidated device data.  Last Pain:  Vitals:   09/05/18 0637  TempSrc: Oral  PainSc: 7          Complications: No apparent anesthesia complications

## 2018-09-05 NOTE — Anesthesia Procedure Notes (Signed)
Anesthesia Regional Block: Interscalene brachial plexus block   Pre-Anesthetic Checklist: ,, timeout performed, Correct Patient, Correct Site, Correct Laterality, Correct Procedure, Correct Position, site marked, Risks and benefits discussed,  Surgical consent,  Pre-op evaluation,  At surgeon's request and post-op pain management  Laterality: Left  Prep: chloraprep       Needles:  Injection technique: Single-shot  Needle Type: Echogenic Stimulator Needle     Needle Length: 5cm  Needle Gauge: 22     Additional Needles:   Procedures:, nerve stimulator,,, ultrasound used (permanent image in chart),,,,   Nerve Stimulator or Paresthesia:  Response: HAND, 0.45 mA,   Additional Responses:   Narrative:  Start time: 09/05/2018 7:10 AM End time: 09/05/2018 7:15 AM Injection made incrementally with aspirations every 5 mL.  Performed by: Personally  Anesthesiologist: Janeece Riggers, MD  Additional Notes: Functioning IV was confirmed and monitors were applied.  A 70mm 22ga Arrow echogenic stimulator needle was used. Sterile prep and drape,hand hygiene and sterile gloves were used. Ultrasound guidance: relevant anatomy identified, needle position confirmed, local anesthetic spread visualized around nerve(s)., vascular puncture avoided.  Image printed for medical record. Negative aspiration and negative test dose prior to incremental administration of local anesthetic. The patient tolerated the procedure well.

## 2018-09-05 NOTE — Op Note (Signed)
09/05/2018  9:01 AM  PATIENT:  [name] [name]    PRE-OPERATIVE DIAGNOSIS: Left shoulder impingement syndrome, biceps dislocation, subscapularis tear, chondromalacia of the glenoid and humerus  POST-OPERATIVE DIAGNOSIS:  Same  PROCEDURE:   1.  Left shoulder arthroscopy with extensive debridement, chondroplasty, biceps tenolysis 2.  Left shoulder arthroscopy with subscapularis repair 3.  Left shoulder arthroscopy with subacromial decompression, acromioplasty, bursectomy  SURGEON:  Johnny Bridge, MD  PHYSICIAN ASSISTANT: Joya Gaskins, OPA-C, present and scrubbed throughout the case, critical for completion in a timely fashion, and for retraction, instrumentation, and closure.  ANESTHESIA:   General with regional interscalene block  PREOPERATIVE INDICATIONS:  [name] [name] is a  58 y.o. male who had a subscapularis tear with biceps dislocation and elected for surgical management.  The risks benefits and alternatives were discussed with the patient preoperatively including but not limited to the risks of infection, bleeding, nerve injury, cardiopulmonary complications, Popeye sign, progression of shoulder arthritis, the need for revision surgery, among others, and the patient was willing to proceed.  ESTIMATED BLOOD LOSS: Minimal  OPERATIVE IMPLANTS: Arthrex bio composite 4.75 mm swivel lock x1 for the subscapularis  OPERATIVE FINDINGS: The shoulder had full motion during examination under anesthesia.  The glenohumeral articular cartilage had extensive chondral fraying, grade 2, and grade 3, and the posterior inferior quadrant had grade 4 chondral loss on the glenoid.  The subscapularis had a large tear in the upper border, with 2 separate segments.  The biceps pulley was present although in very poor condition.  The biceps itself was extremely poor quality, with almost 75% of it torn extensively within the joint, there were very few functional strands left.  The undersurface of the  supraspinatus had 15% fraying of the capsular side, it was completely intact from the bursal side, the infraspinatus was intact.  There was some very slight subacromial spurring, not too much in the way of CA ligament fraying, but I did do an acromioplasty in order to minimize anterior scuffing and impingement.  The subscapularis tendon was torn at the upper third.  OPERATIVE PROCEDURE: The patient was brought to the operating room and placed in the supine position.  General anesthesia was administered.  IV antibiotics were given.  The left upper extremity was examined and had full motion.  He was placed in a beachchair position and the upper extremity was prepped and draped in usual sterile fashion.  Timeout performed.  Diagnostic arthroscopy carried out.  I placed 2 anterior portals, one along the biceps and one just above the subscapularis.  I used the shaver to debride the glenohumeral articular cartilage both on the glenoid and the humeral head side.  There is also a fairly large posterior superior labral tear which I debrided.  I used a spinal needle to pass a PDS suture through the biceps tendon as far down the groove as I could see, although I still did not have very good quality tissue.  I then used a BirdBeak suture passer to pass the stitch a second time.  I used a scissor to release the biceps.  I then attempted to shuttle the biceps into the anterior cannula, and with the placement of just a mild amount of tension the stitch pulled out through the poor quality biceps.  For that reason I felt that biceps tenodesis was not possible, as it was not good enough tendon to hold a stitch.  Therefore I proceeded with the subscapularis repair.  I prepared the lesser tuberosity  with a bur, and then passed a horizontal mattress fiber tape through the subscapularis and then brought it into an anchor and restored the tension of the superior subscapularis back to near anatomic position.  I then went to the  subacromial space, after I debrided the undersurface of the supraspinatus, performed a bursectomy, CA ligament release, and light acromioplasty.  The tendon was intact from above.  I removed the instruments and repaired the portals with Monocryl followed by Steri-Strips and sterile gauze.  He was awakened and returned to the PACU in stable and satisfactory condition.  There were no complications and he tolerated the procedure well.
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
## 2018-09-05 NOTE — Anesthesia Procedure Notes (Signed)
Procedure Name: Intubation Performed by: Verita Lamb, CRNA Pre-anesthesia Checklist: Patient identified, Emergency Drugs available, Suction available, Patient being monitored and Timeout performed Patient Re-evaluated:Patient Re-evaluated prior to induction Oxygen Delivery Method: Circle system utilized Preoxygenation: Pre-oxygenation with 100% oxygen Induction Type: IV induction Ventilation: Two handed mask ventilation required Laryngoscope Size: Mac and 3 Grade View: Grade I Tube type: Oral Tube size: 7.0 mm Number of attempts: 1 Airway Equipment and Method: Stylet Placement Confirmation: ETT inserted through vocal cords under direct vision,  positive ETCO2,  CO2 detector and breath sounds checked- equal and bilateral Secured at: 22 cm Tube secured with: Tape Dental Injury: Teeth and Oropharynx as per pre-operative assessment

## 2018-09-05 NOTE — Progress Notes (Signed)
Assisted Dr. Oddono with left, ultrasound guided, interscalene  block. Side rails up, monitors on throughout procedure. See vital signs in flow sheet. Tolerated Procedure well. 

## 2018-09-06 ENCOUNTER — Ambulatory Visit (HOSPITAL_COMMUNITY): Payer: 59

## 2018-09-06 ENCOUNTER — Other Ambulatory Visit: Payer: Self-pay

## 2018-09-06 ENCOUNTER — Encounter (HOSPITAL_COMMUNITY): Payer: Self-pay

## 2018-09-06 DIAGNOSIS — S46112A Strain of muscle, fascia and tendon of long head of biceps, left arm, initial encounter: Secondary | ICD-10-CM | POA: Diagnosis not present

## 2018-09-06 DIAGNOSIS — K30 Functional dyspepsia: Secondary | ICD-10-CM | POA: Diagnosis present

## 2018-09-06 DIAGNOSIS — M75102 Unspecified rotator cuff tear or rupture of left shoulder, not specified as traumatic: Secondary | ICD-10-CM | POA: Diagnosis not present

## 2018-09-06 DIAGNOSIS — M7542 Impingement syndrome of left shoulder: Secondary | ICD-10-CM | POA: Diagnosis not present

## 2018-09-06 DIAGNOSIS — S46812A Strain of other muscles, fascia and tendons at shoulder and upper arm level, left arm, initial encounter: Secondary | ICD-10-CM

## 2018-09-06 DIAGNOSIS — D3501 Benign neoplasm of right adrenal gland: Secondary | ICD-10-CM | POA: Diagnosis present

## 2018-09-06 DIAGNOSIS — J449 Chronic obstructive pulmonary disease, unspecified: Secondary | ICD-10-CM | POA: Diagnosis present

## 2018-09-06 DIAGNOSIS — I10 Essential (primary) hypertension: Secondary | ICD-10-CM | POA: Diagnosis present

## 2018-09-06 DIAGNOSIS — G4733 Obstructive sleep apnea (adult) (pediatric): Secondary | ICD-10-CM | POA: Diagnosis present

## 2018-09-06 DIAGNOSIS — S43492A Other sprain of left shoulder joint, initial encounter: Secondary | ICD-10-CM | POA: Diagnosis not present

## 2018-09-06 DIAGNOSIS — J189 Pneumonia, unspecified organism: Secondary | ICD-10-CM | POA: Diagnosis present

## 2018-09-06 LAB — CBC WITH DIFFERENTIAL/PLATELET
Abs Immature Granulocytes: 0.08 10*3/uL — ABNORMAL HIGH (ref 0.00–0.07)
Basophils Absolute: 0 10*3/uL (ref 0.0–0.1)
Basophils Relative: 0 %
Eosinophils Absolute: 0 10*3/uL (ref 0.0–0.5)
Eosinophils Relative: 0 %
HCT: 41.2 % (ref 39.0–52.0)
Hemoglobin: 14 g/dL (ref 13.0–17.0)
Immature Granulocytes: 1 %
Lymphocytes Relative: 7 %
Lymphs Abs: 1 10*3/uL (ref 0.7–4.0)
MCH: 31.4 pg (ref 26.0–34.0)
MCHC: 34 g/dL (ref 30.0–36.0)
MCV: 92.4 fL (ref 80.0–100.0)
Monocytes Absolute: 0.8 10*3/uL (ref 0.1–1.0)
Monocytes Relative: 5 %
Neutro Abs: 13.4 10*3/uL — ABNORMAL HIGH (ref 1.7–7.7)
Neutrophils Relative %: 87 %
Platelets: 266 10*3/uL (ref 150–400)
RBC: 4.46 MIL/uL (ref 4.22–5.81)
RDW: 13.1 % (ref 11.5–15.5)
WBC: 15.3 10*3/uL — ABNORMAL HIGH (ref 4.0–10.5)
nRBC: 0 % (ref 0.0–0.2)

## 2018-09-06 LAB — I-STAT CHEM 8, ED
BUN: 17 mg/dL (ref 6–20)
Calcium, Ion: 1.29 mmol/L (ref 1.15–1.40)
Chloride: 102 mmol/L (ref 98–111)
Creatinine, Ser: 0.9 mg/dL (ref 0.61–1.24)
Glucose, Bld: 112 mg/dL — ABNORMAL HIGH (ref 70–99)
HCT: 41 % (ref 39.0–52.0)
Hemoglobin: 13.9 g/dL (ref 13.0–17.0)
Potassium: 4.1 mmol/L (ref 3.5–5.1)
Sodium: 139 mmol/L (ref 135–145)
TCO2: 26 mmol/L (ref 22–32)

## 2018-09-06 LAB — TROPONIN I (HIGH SENSITIVITY)
Troponin I (High Sensitivity): 5 ng/L (ref <18)
Troponin I (High Sensitivity): 6 ng/L (ref <18)

## 2018-09-06 LAB — SARS CORONAVIRUS 2 BY RT PCR (HOSPITAL ORDER, PERFORMED IN ~~LOC~~ HOSPITAL LAB): SARS Coronavirus 2: NEGATIVE

## 2018-09-06 MED ORDER — SODIUM CHLORIDE 0.9 % IV SOLN
2.0000 g | Freq: Once | INTRAVENOUS | Status: AC
  Administered 2018-09-06: 2 g via INTRAVENOUS
  Filled 2018-09-06: qty 2

## 2018-09-06 MED ORDER — IOHEXOL 350 MG/ML SOLN
100.0000 mL | Freq: Once | INTRAVENOUS | Status: AC | PRN
  Administered 2018-09-06: 100 mL via INTRAVENOUS

## 2018-09-06 MED ORDER — VANCOMYCIN HCL 10 G IV SOLR
1500.0000 mg | Freq: Once | INTRAVENOUS | Status: AC
  Administered 2018-09-06: 1500 mg via INTRAVENOUS
  Filled 2018-09-06: qty 1500

## 2018-09-06 MED ORDER — AMOXICILLIN-POT CLAVULANATE 875-125 MG PO TABS: 1.0000 | ORAL_TABLET | Freq: Two times a day (BID) | ORAL | 0 refills | Status: DC

## 2018-09-06 MED ORDER — ALUM & MAG HYDROXIDE-SIMETH 200-200-20 MG/5ML PO SUSP
30.0000 mL | Freq: Once | ORAL | Status: AC
  Administered 2018-09-06: 30 mL via ORAL
  Filled 2018-09-06: qty 30

## 2018-09-06 MED ORDER — LIDOCAINE VISCOUS HCL 2 % MT SOLN
15.0000 mL | Freq: Once | OROMUCOSAL | Status: AC
  Administered 2018-09-06: 15 mL via ORAL
  Filled 2018-09-06: qty 15

## 2018-09-06 NOTE — ED Provider Notes (Addendum)
[facility] EMERGENCY DEPARTMENT Provider Note   CSN: [id number] Arrival date & time: 09/06/18  0020     History   Chief Complaint Chief Complaint  Patient presents with   Irregular Heart Beat    HPI [name] [name] is a 58 y.o. male.     The history is provided by the patient.  Illness Location:  Epigastrum Quality:  "indigestion" for several days.  had surgery earlier in the day sent from pacu Severity:  Mild Onset quality:  Gradual Timing:  Constant Progression:  Unchanged Chronicity:  New Context:  Now post op from biceps repair Relieved by:  Nothing Worsened by:  Nothing Ineffective treatments:  None Associated symptoms: no abdominal pain, no chest pain, no congestion, no cough, no diarrhea, no ear pain, no fatigue, no fever, no headaches, no loss of consciousness, no myalgias, no nausea, no rash, no rhinorrhea, no shortness of breath, no sore throat, no vomiting and no wheezing   Risk factors:  Post op x 18 hours Patient sent from pacu for bradycardia (dw Dr. Griffin Basil) and "heartburn" unresponsive to tums. States he has had indigestion since well before the surgery but they reported saw something and wanted him seen but he does not know what.  No f/c/r.  No cough, no SOB.    Past Medical History:  Diagnosis Date   Full thickness tear of left subscapularis tendon 09/05/2018   Hypertension    Sleep apnea     Patient Active Problem List   Diagnosis Date Noted   Full thickness tear of left subscapularis tendon 09/05/2018    Past Surgical History:  Procedure Laterality Date   CARDIAC CATHETERIZATION     JOINT REPLACEMENT     left hip replacement  1996        Home Medications    Prior to Admission medications   Medication Sig Start Date End Date Taking? Authorizing Provider  gabapentin (NEURONTIN) 300 MG capsule Take 300 mg by mouth 3 (three) times daily.   Yes [provider]  lisinopril (ZESTRIL) 20 MG tablet Take 20  mg by mouth daily.   Yes [provider]  tiZANidine (ZANAFLEX) 4 MG tablet Take 4 mg by mouth 3 (three) times daily.   Yes [provider]  ondansetron (ZOFRAN) 4 MG tablet Take 1 tablet (4 mg total) by mouth every 8 (eight) hours as needed for nausea or vomiting. 09/05/18   Marchia Bond, MD  oxyCODONE (ROXICODONE) 5 MG immediate release tablet Take 1 tablet (5 mg total) by mouth every 4 (four) hours as needed for severe pain. 09/05/18   Marchia Bond, MD  sennosides-docusate sodium (SENOKOT-S) 8.6-50 MG tablet Take 2 tablets by mouth daily. 09/05/18   Marchia Bond, MD    Family History History reviewed. No pertinent family history.  Social History Social History   Tobacco Use   Smoking status: Current Some Day Smoker    Packs/day: 0.25   Smokeless tobacco: Never Used  Substance Use Topics   Alcohol use: Never    Frequency: Never   Drug use: Never     Allergies   Hydrocodone   Review of Systems Review of Systems  Constitutional: Negative for fatigue and fever.  HENT: Negative for congestion, ear pain, rhinorrhea and sore throat.   Eyes: Negative for visual disturbance.  Respiratory: Negative for cough, shortness of breath and wheezing.   Cardiovascular: Negative for chest pain and leg swelling.  Gastrointestinal: Negative for abdominal pain, diarrhea, nausea and vomiting.  Genitourinary: Negative for flank pain.  Musculoskeletal: Negative for myalgias.  Skin: Negative for rash.  Neurological: Negative for loss of consciousness and headaches.  Psychiatric/Behavioral: Negative for agitation.  All other systems reviewed and are negative.    Physical Exam Updated Vital Signs BP (!) 147/92    Pulse 70    Temp (!) 97 F (36.1 C)    Resp 16    Ht 5\' 10"  (1.778 m)    Wt 85.9 kg    SpO2 96%    BMI 27.17 kg/m   Physical Exam Vitals signs and nursing note reviewed.  Constitutional:      General: He is not in acute distress.    Appearance: He is  normal weight. He is not toxic-appearing.  HENT:     Head: Normocephalic and atraumatic.     Nose: Nose normal.     Mouth/Throat:     Mouth: Mucous membranes are moist.     Pharynx: Oropharynx is clear.  Eyes:     Conjunctiva/sclera: Conjunctivae normal.     Pupils: Pupils are equal, round, and reactive to light.  Neck:     Musculoskeletal: Normal range of motion and neck supple.  Cardiovascular:     Rate and Rhythm: Normal rate and regular rhythm.     Pulses: Normal pulses.     Heart sounds: Normal heart sounds.  Pulmonary:     Effort: Pulmonary effort is normal.     Breath sounds: Normal breath sounds.  Abdominal:     General: Abdomen is flat. Bowel sounds are normal.     Tenderness: There is no abdominal tenderness. There is no guarding.  Musculoskeletal: Normal range of motion.        General: No tenderness.  Skin:    General: Skin is warm and dry.     Capillary Refill: Capillary refill takes less than 2 seconds.  Neurological:     General: No focal deficit present.     Mental Status: He is alert and oriented to person, place, and time.  Psychiatric:        Mood and Affect: Mood normal.        Behavior: Behavior normal.      ED Treatments / Results  Labs (all labs ordered are listed, but only abnormal results are displayed) Results for orders placed or performed during the hospital encounter of 09/05/18  CBC with Differential/Platelet  Result Value Ref Range   WBC 15.3 (H) 4.0 - 10.5 K/uL   RBC 4.46 4.22 - 5.81 MIL/uL   Hemoglobin 14.0 13.0 - 17.0 g/dL   HCT 41.2 39.0 - 52.0 %   MCV 92.4 80.0 - 100.0 fL   MCH 31.4 26.0 - 34.0 pg   MCHC 34.0 30.0 - 36.0 g/dL   RDW 13.1 11.5 - 15.5 %   Platelets 266 150 - 400 K/uL   nRBC 0.0 0.0 - 0.2 %   Neutrophils Relative % 87 %   Neutro Abs 13.4 (H) 1.7 - 7.7 K/uL   Lymphocytes Relative 7 %   Lymphs Abs 1.0 0.7 - 4.0 K/uL   Monocytes Relative 5 %   Monocytes Absolute 0.8 0.1 - 1.0 K/uL   Eosinophils Relative 0 %    Eosinophils Absolute 0.0 0.0 - 0.5 K/uL   Basophils Relative 0 %   Basophils Absolute 0.0 0.0 - 0.1 K/uL   Immature Granulocytes 1 %   Abs Immature Granulocytes 0.08 (H) 0.00 - 0.07 K/uL  I-stat chem 8, ED (not at [facility]  or [facility])  Result Value Ref Range   Sodium 139 135 - 145 mmol/L   Potassium 4.1 3.5 - 5.1 mmol/L   Chloride 102 98 - 111 mmol/L   BUN 17 6 - 20 mg/dL   Creatinine, Ser 0.90 0.61 - 1.24 mg/dL   Glucose, Bld 112 (H) 70 - 99 mg/dL   Calcium, Ion 1.29 1.15 - 1.40 mmol/L   TCO2 26 22 - 32 mmol/L   Hemoglobin 13.9 13.0 - 17.0 g/dL   HCT 41.0 39.0 - 52.0 %  Troponin I (High Sensitivity)  Result Value Ref Range   Troponin I (High Sensitivity) 5 <18 ng/L  Troponin I (High Sensitivity)  Result Value Ref Range   Troponin I (High Sensitivity) 6 <18 ng/L   Ct Angio Chest Pe W And/or Wo Contrast  Result Date: 09/06/2018 CLINICAL DATA:  58 year old male with history of hypertension and postoperative for shoulder surgery. Nurse noted EKG changes. EXAM: CT ANGIOGRAPHY CHEST WITH CONTRAST TECHNIQUE: Multidetector CT imaging of the chest was performed using the standard protocol during bolus administration of intravenous contrast. Multiplanar CT image reconstructions and MIPs were obtained to evaluate the vascular anatomy. CONTRAST:  141mL OMNIPAQUE IOHEXOL 350 MG/ML SOLN COMPARISON:  Chest radiograph dated 09/06/2018 FINDINGS: Cardiovascular: There is no cardiomegaly or pericardial effusion. The thoracic aorta is unremarkable. The origins of the great vessels of the aortic arch are patent as visualized. There is no pulmonary artery embolism. Mediastinum/Nodes: No hilar or mediastinal adenopathy. The esophagus and the thyroid gland are grossly unremarkable. No mediastinal fluid collection. Lungs/Pleura: There is moderate centrilobular and paraseptal emphysema. Patchy areas of consolidative change in the left lower lobe and lingula noted which may represent atelectasis or infiltrate. Clinical  correlation is recommended. Right lung base linear atelectasis/scarring. There is no pleural effusion or pneumothorax. The central airways are patent. Upper Abdomen: There is a 17 mm right adrenal adenoma. Musculoskeletal: No acute osseous pathology. Soft tissue air noted in the left subpectoral region and in the base of the left side of the neck which may be related to recent surgery. No drainable fluid collection. Clinical correlation is recommended. Review of the MIP images confirms the above findings. IMPRESSION: 1. No CT evidence of pulmonary artery embolism. 2. Emphysema with patchy areas of consolidative change in the left lower lobe and lingula which may represent atelectasis or infiltrate. Clinical correlation is recommended. 3. Right adrenal adenoma. 4. Soft tissue air in the left subpectoral region and base of the left side of the neck likely related to recent surgery. No drainable fluid collection. Clinical correlation is recommended. 5. Emphysema (ICD10-J43.9). Electronically Signed   By: Anner Crete M.D.   On: 09/06/2018 03:44   Dg Chest Portable 1 View  Result Date: 09/06/2018 CLINICAL DATA:  58 year old male postoperative for shoulder surgery. History of hypertension. EXAM: PORTABLE CHEST 1 VIEW COMPARISON:  None. FINDINGS: There is mild eventration of the left hemidiaphragm. Left lung base density likely atelectatic changes although infiltrate is not excluded. Clinical correlation is recommended. The right lung is clear. No pneumothorax. Mild cardiomegaly. No acute osseous pathology. IMPRESSION: 1. Left lung base atelectasis versus infiltrate. 2. Mild cardiomegaly. Electronically Signed   By: Anner Crete M.D.   On: 09/06/2018 01:01    EKG  Date: 09/06/2018  Rate:57  Rhythm: normal sinus rhythm  QRS Axis: normal  Intervals: normal  ST/T Wave abnormalities: normal  Conduction Disutrbances: none  Narrative Interpretation: unremarkable     Radiology Dg Chest Portable 1  View  Result Date: 09/06/2018 CLINICAL DATA:  58 year old male postoperative for shoulder surgery. History of hypertension. EXAM: PORTABLE CHEST 1 VIEW COMPARISON:  None. FINDINGS: There is mild eventration of the left hemidiaphragm. Left lung base density likely atelectatic changes although infiltrate is not excluded. Clinical correlation is recommended. The right lung is clear. No pneumothorax. Mild cardiomegaly. No acute osseous pathology. IMPRESSION: 1. Left lung base atelectasis versus infiltrate. 2. Mild cardiomegaly. Electronically Signed   By: Anner Crete M.D.   On: 09/06/2018 01:01    Procedures Procedures (including critical care time)  Medications Ordered in ED GI cocktail Vancomycin and cefepime    2 negative troponins in the ER    Case d/w Dr. Myna Hidalgo, have Epic inboxed Dr. Mardelle Matte for ortho team to round on patient as an inpatient.  On call for [employer] paged.  DW Dr. Griffin Basil,  Dr. Mardelle Matte to round on patient.     Final Clinical Impressions(s) / ED Diagnoses   Admit for HCAP     Kassidie Hendriks, MD 09/06/18 1610    Randal Buba, Marka Treloar, MD 09/06/18 [place], Duke Weisensel, MD 09/06/18 [id number]

## 2018-09-06 NOTE — ED Provider Notes (Signed)
58 year old male who was an outpatient surgery yesterday for his shoulder and sounds like he was very slow to recover and having some hypoxia there.  Ultimately was transferred over to the emergency department here where he was worked up and found to have a possible pneumonia. Physical Exam  BP (!) 128/94   Pulse (!) 59   Temp 97.7 F (36.5 C) (Oral)   Resp 18   Ht 5\' 10"  (1.778 m)   Wt 85.9 kg   SpO2 94%   BMI 27.17 kg/m   Physical Exam  ED Course/Procedures     Procedures  MDM  Patient was seen by the hospitalist triad Dr. Lorin Mercy who felt that he would be appropriate for outpatient treatment of this.  I found the patient sitting at side of the bed satting 95% on room air breathing comfortably eating breakfast.  He was also already seen by his orthopedic surgeon and felt to be doing well from a postoperative standpoint from an orthopedics evaluation.  The patient is very comfortable with going home and he said he rather recover at home.  We will send him out on some oral Augmentin with clear instructions to return if any worsening symptoms.       Hayden Rasmussen, MD 09/06/18 [phone]

## 2018-09-06 NOTE — Consult Note (Signed)
ER Consult Note   [name] [name] HLK:[id number] DOB: 1960-07-13 DOA: 09/05/2018  PCP: Monico Blitz, MD Consultants:  Mardelle Matte - orthopedics Patient coming from:  Home - lives with wife; NOK: Wife, [phone], [phone]  Chief Complaint: EKG changes  HPI: [name] [name] is a 58 y.o. male with medical history significant of OSA, COPD, and HTN presenting with EKG changes post-operatively.  He had a left shoulder arthroscopy with debridement, chondroplasty, and biceps tenolysis as well as subscapularis repair and subacromial decompression with acromioplasty and bursectomy yesterday by Dr. Mardelle Matte.  He reports having a surgery yesterday.  He felt fairly good post-operatively but still some chronic pain in back and neck.  He noted indigestion and heartburn.  He was given Tums x 3 without relief.  He had an EKG and it "didn't look right" and BP was 162/98 and so EMS was called.  EKG with EMS "looked good."  Given GI cocktail without much improvement.  Had CXR and CT and told he had PNA.  He thinks he is better now with regards to indigestion, heartburn, nausea and attributes this to "the medicine."  He did have food from [employer] post-operatively.  He did not have fever, cough, SOB.     ED Course:  Carryover, per Dr. Myna Hidalgo:  20 yom with HTN and biceps tendon tear s/p outpatient surgery on 8/13, sent from there for unspecified EKG abnormality. Had sinus brady in ED that appears to be asymptomatic. A CTA chest was obtained and concerning for atelectasis vs infection. He has leukocytosis but no fever or hypoxia and is not coughing. ED feels that he requires admission for PNA despite relatively low PORT score.   Review of Systems: As per HPI; otherwise review of systems reviewed and negative.   Ambulatory Status:  Ambulates without assistance  Past Medical History:  Diagnosis Date  . Full thickness tear of left subscapularis tendon 09/05/2018  . Hypertension   . Sleep apnea 1996   does not  wear CPAP    Past Surgical History:  Procedure Laterality Date  . CARDIAC CATHETERIZATION  2010  . JOINT REPLACEMENT    . left hip replacement  1996    Social History   Socioeconomic History  . Marital status: Married    Spouse name: Not on file  . Number of children: Not on file  . Years of education: Not on file  . Highest education level: Not on file  Occupational History  . Occupation: Social worker  Social Needs  . Financial resource strain: Not on file  . Food insecurity    Worry: Not on file    Inability: Not on file  . Transportation needs    Medical: Not on file    Non-medical: Not on file  Tobacco Use  . Smoking status: Current Every Day Smoker    Packs/day: 0.50    Years: 41.00    Pack years: 20.50  . Smokeless tobacco: Never Used  Substance and Sexual Activity  . Alcohol use: Never    Frequency: Never  . Drug use: Never  . Sexual activity: Not on file  Lifestyle  . Physical activity    Days per week: Not on file    Minutes per session: Not on file  . Stress: Not on file  Relationships  . Social Herbalist on phone: Not on file    Gets together: Not on file    Attends religious service: Not on file    Active member  of club or organization: Not on file    Attends meetings of clubs or organizations: Not on file    Relationship status: Not on file  . Intimate partner violence    Fear of current or ex partner: Not on file    Emotionally abused: Not on file    Physically abused: Not on file    Forced sexual activity: Not on file  Other Topics Concern  . Not on file  Social History Narrative  . Not on file    Allergies  Allergen Reactions  . Hydrocodone     hallucinations    History reviewed. No pertinent family history.  Prior to Admission medications   Medication Sig Start Date End Date Taking? Authorizing Provider  gabapentin (NEURONTIN) 300 MG capsule Take 300 mg by mouth 3 (three) times daily.   Yes [provider]  lisinopril (ZESTRIL) 20 MG tablet Take 20 mg by mouth daily.   Yes [provider]  tiZANidine (ZANAFLEX) 4 MG tablet Take 4 mg by mouth 3 (three) times daily.   Yes [provider]  ondansetron (ZOFRAN) 4 MG tablet Take 1 tablet (4 mg total) by mouth every 8 (eight) hours as needed for nausea or vomiting. 09/05/18   Marchia Bond, MD  oxyCODONE (ROXICODONE) 5 MG immediate release tablet Take 1 tablet (5 mg total) by mouth every 4 (four) hours as needed for severe pain. 09/05/18   Marchia Bond, MD  sennosides-docusate sodium (SENOKOT-S) 8.6-50 MG tablet Take 2 tablets by mouth daily. 09/05/18   Marchia Bond, MD    Physical Exam: Vitals:   09/06/18 0230 09/06/18 0245 09/06/18 0300 09/06/18 0500  BP: (!) 132/93 (!) 131/101 (!) 144/91 (!) 128/94  Pulse: 64 (!) 59 78 (!) 59  Resp: 17 20 18    Temp:   97.7 F (36.5 C)   TempSrc:   Oral   SpO2: 92% 91% 96% 94%  Weight:      Height:         . General:  Appears calm and comfortable and is NAD . Eyes:  PERRL, EOMI, normal lids, iris . ENT:  grossly normal hearing, lips & tongue, mmm . Neck:  no LAD, masses or thyromegaly . Cardiovascular:  RRR, no m/r/g. No LE edema.  Marland Kitchen Respiratory:   CTA bilaterally with no wheezes/rales/rhonchi.  Normal respiratory effort. . Abdomen:  soft, NT, ND, NABS . Back:   normal alignment, no CVAT . Skin:  no rash or induration seen on limited exam . Musculoskeletal:  L shoulder surgery bandage in place . Psychiatric:  grossly normal mood and affect, speech fluent and appropriate, AOx3 . Neurologic:  CN 2-12 grossly intact, moves all extremities in coordinated fashion, sensation intact    Radiological Exams on Admission: Ct Angio Chest Pe W And/or Wo Contrast  Result Date: 09/06/2018 CLINICAL DATA:  58 year old male with history of hypertension and postoperative for shoulder surgery. Nurse noted EKG changes. EXAM: CT ANGIOGRAPHY CHEST WITH CONTRAST TECHNIQUE: Multidetector  CT imaging of the chest was performed using the standard protocol during bolus administration of intravenous contrast. Multiplanar CT image reconstructions and MIPs were obtained to evaluate the vascular anatomy. CONTRAST:  171mL OMNIPAQUE IOHEXOL 350 MG/ML SOLN COMPARISON:  Chest radiograph dated 09/06/2018 FINDINGS: Cardiovascular: There is no cardiomegaly or pericardial effusion. The thoracic aorta is unremarkable. The origins of the great vessels of the aortic arch are patent as visualized. There is no pulmonary artery embolism. Mediastinum/Nodes: No hilar or mediastinal adenopathy. The esophagus and  the thyroid gland are grossly unremarkable. No mediastinal fluid collection. Lungs/Pleura: There is moderate centrilobular and paraseptal emphysema. Patchy areas of consolidative change in the left lower lobe and lingula noted which may represent atelectasis or infiltrate. Clinical correlation is recommended. Right lung base linear atelectasis/scarring. There is no pleural effusion or pneumothorax. The central airways are patent. Upper Abdomen: There is a 17 mm right adrenal adenoma. Musculoskeletal: No acute osseous pathology. Soft tissue air noted in the left subpectoral region and in the base of the left side of the neck which may be related to recent surgery. No drainable fluid collection. Clinical correlation is recommended. Review of the MIP images confirms the above findings. IMPRESSION: 1. No CT evidence of pulmonary artery embolism. 2. Emphysema with patchy areas of consolidative change in the left lower lobe and lingula which may represent atelectasis or infiltrate. Clinical correlation is recommended. 3. Right adrenal adenoma. 4. Soft tissue air in the left subpectoral region and base of the left side of the neck likely related to recent surgery. No drainable fluid collection. Clinical correlation is recommended. 5. Emphysema (ICD10-J43.9). Electronically Signed   By: Anner Crete M.D.   On:  09/06/2018 03:44   Dg Chest Portable 1 View  Result Date: 09/06/2018 CLINICAL DATA:  58 year old male postoperative for shoulder surgery. History of hypertension. EXAM: PORTABLE CHEST 1 VIEW COMPARISON:  None. FINDINGS: There is mild eventration of the left hemidiaphragm. Left lung base density likely atelectatic changes although infiltrate is not excluded. Clinical correlation is recommended. The right lung is clear. No pneumothorax. Mild cardiomegaly. No acute osseous pathology. IMPRESSION: 1. Left lung base atelectasis versus infiltrate. 2. Mild cardiomegaly. Electronically Signed   By: Anner Crete M.D.   On: 09/06/2018 01:01    EKG: Independently reviewed.  Sinus bradycardia with rate 57;  no evidence of acute ischemia   Labs on Admission: I have personally reviewed the available labs and imaging studies at the time of the admission.  Pertinent labs:   Glucose 112 HS troponin 5, 6 WBC 15.3 COVID negative 8/14 and 8/10  Assessment/Plan Principal Problem:   Full thickness tear of left subscapularis tendon Active Problems:   Pneumonia   COPD (chronic obstructive pulmonary disease) (HCC)   Adrenal adenoma, right   OSA (obstructive sleep apnea)   Essential hypertension   Indigestion   Scapularis tear -Patient with surgery yesterday -Appropriate post-surgical course -Has scheduled ortho f/u with Dr. Mardelle Matte 8/26  Indigestion -Patient with severe indigestion post-operatively -May have been a combination of phrenic nerve paralysis from anesthesia; other anesthesia complication; and [employer] meal post-operatively -Regardless, this issue has resolved and the patient is feeling well  Possible PNA -Patient with ?LLL infiltrate on CXR/CT -No significant symptoms  -As above, possible related to diaphragmatic block -Also possible aspiration given severity of indigestion -Regardless, he is hemodynamically stable and well-appearing and is appropriate for outpatient treatment  -He was given an Rx for Augmentin by Dr. Melina Copa  COPD -Noted on CT -Suggest PCP f/u -Needs smoking cessation  HTN -Continue Lisinopril  OSA -Consider adding CPAP  Adrenal adenoma -Incidental finding on CT -Suggest outpatient f/u; if stable, likely does not need ongoing surveillance   Note: This patient has been tested and is negative for the novel coronavirus COVID-19.  Thank you for this interesting consult.  The patient appeared appropriate for d/c to home at the time of my evaluation, and Dr. Mardelle Matte also saw him and agreed.  As such, he was given an rx for Augmentin by  the EDP and should f/u with his PCP next week.   Karmen Bongo MD [facility]   How to contact the [facility] Attending or Consulting provider [name] or covering provider during after hours [place], for this patient?  1. Check the care team in [facility] and look for a) attending/consulting [facility] provider listed and b) the [facility] team listed 2. Log into [email or web address] and use [place]'s universal password to access. If you do not have the password, please contact the hospital operator. 3. Locate the [facility] provider you are looking for under [facility] and page to a number that you can be directly reached. 4. If you still have difficulty reaching the provider, please page the [facility] (Director on Call) for the Hospitalists listed on amion for assistance.   09/06/2018, 6:58 PM

## 2018-09-06 NOTE — Anesthesia Postprocedure Evaluation (Signed)
Anesthesia Post Note  Patient: [name] [name]  Procedure(s) Performed: LEFT SHOULDER ARTHROSCOPY WITH SUBSCAPULARIS AND SUBACROMIAL DECOMPRESSION, BICEP TENODESIS, EXTENSIVE DEBRIDEMENT (Left Shoulder)     Patient location during evaluation: PACU Anesthesia Type: General Level of consciousness: awake and alert Pain management: pain level controlled Vital Signs Assessment: post-procedure vital signs reviewed and stable Respiratory status: spontaneous breathing, nonlabored ventilation, respiratory function stable and patient connected to nasal cannula oxygen Cardiovascular status: blood pressure returned to baseline and stable Postop Assessment: no apparent nausea or vomiting Anesthetic complications: no    Last Vitals:  Vitals:   09/06/18 0300 09/06/18 0500  BP: (!) 144/91 (!) 128/94  Pulse: 78 (!) 59  Resp: 18   Temp: 36.5 C   SpO2: 96% 94%    Last Pain:  Vitals:   09/06/18 0350  TempSrc:   PainSc: 0-No pain                 Darrius Montano

## 2018-09-06 NOTE — ED Triage Notes (Signed)
Pt coming from day surgery where he was POP for shoulder surgery. RN noted EKG changes and sent pt here for evaluation.

## 2018-09-06 NOTE — Progress Notes (Signed)
Patient seen and examined.  He was transferred from [facility] day surgery because he had significant chest pain overnight, there was concern about possible EKG changes, he has sleep apnea, does not use a CPAP, he also had [employer] last night, the oral antacids were not helping with his chest pain.  He is now feeling much better, feels basically back to normal, his dressings are clean, his block is worn off, all fingers flex extend and abduct.  I will defer to the medical service whether or not he has any significant pneumonia or myocardial issues, I believe that they are considering adding some oral antibiotics and discharge him home, which sounds like a reasonable plan to me.  I will plan to see him in the office for his regularly scheduled appointment postoperatively in 2 weeks.  Johnny Bridge, MD

## 2018-09-06 NOTE — ED Notes (Signed)
ED TO INPATIENT HANDOFF REPORT  ED Nurse Name and Phone #: Ophelia Charter RN [phone]  S Name/Age/Gender [name] [name] 58 y.o. male Room/Bed: 017C/017C  Code Status   Code Status: Full Code  Home/SNF/Other Home Patient oriented to: self, place, time and situation Is this baseline? Yes   Triage Complete: Triage complete  Chief Complaint LEFT SHOULDER IMPINGEMENT SYNDROME, ROTATOR CUFF TEAR, BICEP TENODESIS  Triage Note Pt coming from day surgery where he was POP for shoulder surgery. RN noted EKG changes and sent pt here for evaluation.    Allergies Allergies  Allergen Reactions  . Hydrocodone     hallucinations    Level of Care/Admitting Diagnosis ED Disposition    ED Disposition Condition [facility] Area: [place] [[id number]]  Level of Care: Telemetry Medical [104]  I expect the patient will be discharged within 24 hours: Yes  LOW acuity---Tx typically complete <24 hrs---ACUTE conditions typically can be evaluated <24 hours---LABS likely to return to acceptable levels <24 hours---IS near functional baseline---EXPECTED to return to current living arrangement---NOT newly hypoxic: Meets criteria for 5C-Observation unit  Covid Evaluation: Asymptomatic Screening Protocol (No Symptoms)  Diagnosis: Pneumonia [789381]  Admitting Physician: Vianne Bulls [[id number]]  Attending Physician: Vianne Bulls [[id number]]  PT Class (Do Not Modify): Observation [104]  PT Acc Code (Do Not Modify): Observation [10022]       B Medical/Surgery History Past Medical History:  Diagnosis Date  . Full thickness tear of left subscapularis tendon 09/05/2018  . Hypertension   . Sleep apnea    Past Surgical History:  Procedure Laterality Date  . CARDIAC CATHETERIZATION    . JOINT REPLACEMENT    . left hip replacement  1996     A IV Location/Drains/Wounds Patient Lines/Drains/Airways Status   Active Line/Drains/Airways    Name:   Placement date:    Placement time:   Site:   Days:   Peripheral IV 09/05/18 Right Hand   09/05/18    0643    Hand   1   Peripheral IV 09/06/18 Anterior;Right Forearm   09/06/18    0317    Forearm   less than 1   Incision (Closed) 09/05/18 Shoulder Left   09/05/18    0908     1          Intake/Output Last 24 hours  Intake/Output Summary (Last 24 hours) at 09/06/2018 0657 Last data filed at 09/05/2018 2200 Gross per 24 hour  Intake 3240.58 ml  Output 30 ml  Net 3210.58 ml    Labs/Imaging Results for orders placed or performed during the hospital encounter of 09/05/18 (from the past 48 hour(s))  CBC with Differential/Platelet     Status: Abnormal   Collection Time: 09/06/18  1:02 AM  Result Value Ref Range   WBC 15.3 (H) 4.0 - 10.5 K/uL   RBC 4.46 4.22 - 5.81 MIL/uL   Hemoglobin 14.0 13.0 - 17.0 g/dL   HCT 41.2 39.0 - 52.0 %   MCV 92.4 80.0 - 100.0 fL   MCH 31.4 26.0 - 34.0 pg   MCHC 34.0 30.0 - 36.0 g/dL   RDW 13.1 11.5 - 15.5 %   Platelets 266 150 - 400 K/uL   nRBC 0.0 0.0 - 0.2 %   Neutrophils Relative % 87 %   Neutro Abs 13.4 (H) 1.7 - 7.7 K/uL   Lymphocytes Relative 7 %   Lymphs Abs 1.0 0.7 - 4.0 K/uL   Monocytes Relative  5 %   Monocytes Absolute 0.8 0.1 - 1.0 K/uL   Eosinophils Relative 0 %   Eosinophils Absolute 0.0 0.0 - 0.5 K/uL   Basophils Relative 0 %   Basophils Absolute 0.0 0.0 - 0.1 K/uL   Immature Granulocytes 1 %   Abs Immature Granulocytes 0.08 (H) 0.00 - 0.07 K/uL    Comment: Performed at [facility] [address], [place], [place] [address]  I-stat chem 8, ED (not at [facility] or [facility])     Status: Abnormal   Collection Time: 09/06/18  1:08 AM  Result Value Ref Range   Sodium 139 135 - 145 mmol/L   Potassium 4.1 3.5 - 5.1 mmol/L   Chloride 102 98 - 111 mmol/L   BUN 17 6 - 20 mg/dL   Creatinine, Ser 0.90 0.61 - 1.24 mg/dL   Glucose, Bld 112 (H) 70 - 99 mg/dL   Calcium, Ion 1.29 1.15 - 1.40 mmol/L   TCO2 26 22 - 32 mmol/L   Hemoglobin 13.9 13.0 - 17.0 g/dL   HCT  41.0 39.0 - 52.0 %  Troponin I (High Sensitivity)     Status: None   Collection Time: 09/06/18  1:08 AM  Result Value Ref Range   Troponin I (High Sensitivity) 5 <18 ng/L    Comment: (NOTE) Elevated high sensitivity troponin I (hsTnI) values and significant  changes across serial measurements may suggest ACS but many other  chronic and acute conditions are known to elevate hsTnI results.  Refer to the "Links" section for chest pain algorithms and additional  guidance. Performed at [facility], [place] [address], [place], [place] [address]   Troponin I (High Sensitivity)     Status: None   Collection Time: 09/06/18  2:50 AM  Result Value Ref Range   Troponin I (High Sensitivity) 6 <18 ng/L    Comment: (NOTE) Elevated high sensitivity troponin I (hsTnI) values and significant  changes across serial measurements may suggest ACS but many other  chronic and acute conditions are known to elevate hsTnI results.  Refer to the "Links" section for chest pain algorithms and additional  guidance. Performed at [facility], [place] [address] [place], [place] [address]    Ct Angio Chest Pe W And/or Wo Contrast  Result Date: 09/06/2018 CLINICAL DATA:  58 year old male with history of hypertension and postoperative for shoulder surgery. Nurse noted EKG changes. EXAM: CT ANGIOGRAPHY CHEST WITH CONTRAST TECHNIQUE: Multidetector CT imaging of the chest was performed using the standard protocol during bolus administration of intravenous contrast. Multiplanar CT image reconstructions and MIPs were obtained to evaluate the vascular anatomy. CONTRAST:  155mL OMNIPAQUE IOHEXOL 350 MG/ML SOLN COMPARISON:  Chest radiograph dated 09/06/2018 FINDINGS: Cardiovascular: There is no cardiomegaly or pericardial effusion. The thoracic aorta is unremarkable. The origins of the great vessels of the aortic arch are patent as visualized. There is no pulmonary artery embolism. Mediastinum/Nodes: No hilar or  mediastinal adenopathy. The esophagus and the thyroid gland are grossly unremarkable. No mediastinal fluid collection. Lungs/Pleura: There is moderate centrilobular and paraseptal emphysema. Patchy areas of consolidative change in the left lower lobe and lingula noted which may represent atelectasis or infiltrate. Clinical correlation is recommended. Right lung base linear atelectasis/scarring. There is no pleural effusion or pneumothorax. The central airways are patent. Upper Abdomen: There is a 17 mm right adrenal adenoma. Musculoskeletal: No acute osseous pathology. Soft tissue air noted in the left subpectoral region and in the base of the left  side of the neck which may be related to recent surgery. No drainable fluid collection. Clinical correlation is recommended. Review of the MIP images confirms the above findings. IMPRESSION: 1. No CT evidence of pulmonary artery embolism. 2. Emphysema with patchy areas of consolidative change in the left lower lobe and lingula which may represent atelectasis or infiltrate. Clinical correlation is recommended. 3. Right adrenal adenoma. 4. Soft tissue air in the left subpectoral region and base of the left side of the neck likely related to recent surgery. No drainable fluid collection. Clinical correlation is recommended. 5. Emphysema (ICD10-J43.9). Electronically Signed   By: Anner Crete M.D.   On: 09/06/2018 03:44   Dg Chest Portable 1 View  Result Date: 09/06/2018 CLINICAL DATA:  58 year old male postoperative for shoulder surgery. History of hypertension. EXAM: PORTABLE CHEST 1 VIEW COMPARISON:  None. FINDINGS: There is mild eventration of the left hemidiaphragm. Left lung base density likely atelectatic changes although infiltrate is not excluded. Clinical correlation is recommended. The right lung is clear. No pneumothorax. Mild cardiomegaly. No acute osseous pathology. IMPRESSION: 1. Left lung base atelectasis versus infiltrate. 2. Mild cardiomegaly.  Electronically Signed   By: Anner Crete M.D.   On: 09/06/2018 01:01    Pending Labs Unresulted Labs (From admission, onward)    Start     Ordered   09/06/18 6063  Blood culture (routine x 2)  BLOOD CULTURE X 2,   STAT     09/06/18 0160   09/06/18 0425  SARS Coronavirus 2 [facility] order, Performed in [facility] hospital lab) Nasopharyngeal Nasopharyngeal Swab  (Symptomatic/High Risk of Exposure/Tier 1 Patients Labs with Precautions)  Once,   STAT    Question Answer Comment  Is this test for diagnosis or screening Diagnosis of ill patient   Symptomatic for COVID-19 as defined by CDC Yes   Date of Symptom Onset 09/06/2018   Hospitalized for COVID-19 Yes   Admitted to ICU for COVID-19 No   Previously tested for COVID-19 Yes   Resident in a congregate (group) care setting No   Employed in healthcare setting No      09/06/18 0424   09/06/18 0045  CBC with Differential/Platelet  Once,   STAT     09/06/18 0045          Vitals/Pain Today's Vitals   09/06/18 0230 09/06/18 0245 09/06/18 0300 09/06/18 0350  BP: (!) 132/93 (!) 131/101 (!) 144/91   Pulse: 64 (!) 59 78   Resp: 17 20 18    Temp:   97.7 F (36.5 C)   TempSrc:   Oral   SpO2: 92% 91% 96%   Weight:      Height:      PainSc:    0-No pain    Isolation Precautions Airborne and Contact precautions  Medications Medications  lisinopril (ZESTRIL) tablet 20 mg (20 mg Oral Not Given 09/05/18 1156)  gabapentin (NEURONTIN) capsule 300 mg (300 mg Oral Given 09/05/18 2220)  tiZANidine (ZANAFLEX) tablet 4 mg (4 mg Oral Not Given 09/05/18 2221)  0.9 %  sodium chloride infusion ( Intravenous Stopped 09/06/18 0300)  ceFAZolin (ANCEF) IVPB 1 g/50 mL premix (1 g Intravenous Not Given 09/06/18 0549)  methocarbamol (ROBAXIN) tablet 500 mg (500 mg Oral Given 09/05/18 2001)    Or  methocarbamol (ROBAXIN) 500 mg in dextrose 5 % 50 mL IVPB ( Intravenous See Alternative 09/05/18 2001)  zolpidem (AMBIEN) tablet 5 mg (has no administration in  time range)  docusate sodium (COLACE) capsule 100 mg (  100 mg Oral Given 09/05/18 2220)  senna (SENOKOT) tablet 8.6 mg (8.6 mg Oral Given 09/05/18 2220)  polyethylene glycol (MIRALAX / GLYCOLAX) packet 17 g (has no administration in time range)  bisacodyl (DULCOLAX) suppository 10 mg (has no administration in time range)  magnesium citrate solution 1 Bottle (has no administration in time range)  ondansetron (ZOFRAN) tablet 4 mg (has no administration in time range)    Or  ondansetron (ZOFRAN) injection 4 mg (has no administration in time range)  metoCLOPramide (REGLAN) tablet 5-10 mg (has no administration in time range)    Or  metoCLOPramide (REGLAN) injection 5-10 mg (has no administration in time range)  acetaminophen (TYLENOL) tablet 325-650 mg (has no administration in time range)  oxyCODONE (Oxy IR/ROXICODONE) immediate release tablet 5-10 mg (10 mg Oral Given 09/05/18 2221)  oxyCODONE (Oxy IR/ROXICODONE) immediate release tablet 10-15 mg (has no administration in time range)  HYDROmorphone (DILAUDID) injection 0.5-1 mg (has no administration in time range)  acetaminophen (TYLENOL) tablet 1,000 mg (1,000 mg Oral Not Given 09/06/18 0549)  vancomycin (VANCOCIN) 1,500 mg in sodium chloride 0.9 % 500 mL IVPB (1,500 mg Intravenous New Bag/Given 09/06/18 0628)  ceFAZolin (ANCEF) IVPB 2g/100 mL premix (2 g Intravenous Given 09/05/18 0740)  acetaminophen (TYLENOL) tablet 1,000 mg (1,000 mg Oral Given 09/05/18 0645)  alum & mag hydroxide-simeth (MAALOX/MYLANTA) 200-200-20 MG/5ML suspension 30 mL (30 mLs Oral Given 09/06/18 0120)    And  lidocaine (XYLOCAINE) 2 % viscous mouth solution 15 mL (15 mLs Oral Given 09/06/18 0120)  iohexol (OMNIPAQUE) 350 MG/ML injection 100 mL (100 mLs Intravenous Contrast Given 09/06/18 0324)  ceFEPIme (MAXIPIME) 2 g in sodium chloride 0.9 % 100 mL IVPB (0 g Intravenous Stopped 09/06/18 0526)  midazolam (VERSED) 2 MG/2ML injection (has no administration in time range)   acetaminophen (TYLENOL) 500 MG tablet (has no administration in time range)  fentaNYL (SUBLIMAZE) 100 MCG/2ML injection (has no administration in time range)  ceFAZolin (ANCEF) 2-4 GM/100ML-% IVPB (has no administration in time range)  lidocaine 20 MG/ML injection (has no administration in time range)  rocuronium bromide 100 MG/10ML SOSY (has no administration in time range)  dexamethasone (DECADRON) 10 MG/ML injection (has no administration in time range)  ondansetron (ZOFRAN) 4 MG/2ML injection (has no administration in time range)  fentaNYL (SUBLIMAZE) 100 MCG/2ML injection (has no administration in time range)  midazolam (VERSED) 2 MG/2ML injection (has no administration in time range)  propofol (DIPRIVAN) 10 mg/mL bolus/IV push (has no administration in time range)  albuterol (PROVENTIL) (2.5 MG/3ML) 0.083% nebulizer solution (has no administration in time range)  Racepinephrine HCl 2.25 % nebulizer solution (has no administration in time range)  sodium chloride 0.9 % nebulizer solution (has no administration in time range)    Mobility walks Low fall risk   Focused Assessments    R Recommendations: See Admitting Provider Note  Report given to:   Additional Notes:

## 2018-09-06 NOTE — ED Notes (Signed)
Breakfast tray ordered 

## 2018-09-09 ENCOUNTER — Encounter (HOSPITAL_BASED_OUTPATIENT_CLINIC_OR_DEPARTMENT_OTHER): Payer: Self-pay | Admitting: Orthopedic Surgery

## 2018-09-11 LAB — CULTURE, BLOOD (ROUTINE X 2)
Culture: NO GROWTH
Culture: NO GROWTH
Special Requests: ADEQUATE
Special Requests: ADEQUATE

## 2018-10-16 ENCOUNTER — Other Ambulatory Visit (HOSPITAL_BASED_OUTPATIENT_CLINIC_OR_DEPARTMENT_OTHER): Payer: Self-pay

## 2018-10-16 DIAGNOSIS — G4710 Hypersomnia, unspecified: Secondary | ICD-10-CM

## 2018-10-25 ENCOUNTER — Ambulatory Visit: Payer: 59 | Attending: Internal Medicine | Admitting: Neurology

## 2018-10-25 ENCOUNTER — Emergency Department (HOSPITAL_COMMUNITY): Admission: EM | Admit: 2018-10-25 | Discharge: 2018-10-25 | Discharge status: Absent without leave | Payer: 59

## 2018-10-25 ENCOUNTER — Other Ambulatory Visit: Payer: Self-pay

## 2018-10-25 DIAGNOSIS — G4710 Hypersomnia, unspecified: Secondary | ICD-10-CM | POA: Insufficient documentation

## 2018-10-25 DIAGNOSIS — G4733 Obstructive sleep apnea (adult) (pediatric): Secondary | ICD-10-CM | POA: Diagnosis present

## 2018-10-25 DIAGNOSIS — Z79899 Other long term (current) drug therapy: Secondary | ICD-10-CM | POA: Insufficient documentation

## 2018-10-25 DIAGNOSIS — Z7901 Long term (current) use of anticoagulants: Secondary | ICD-10-CM | POA: Insufficient documentation

## 2018-10-25 DIAGNOSIS — R0683 Snoring: Secondary | ICD-10-CM | POA: Diagnosis not present

## 2018-11-02 NOTE — Procedures (Signed)
  Reynolds A. Merlene Laughter, MD     [email or web address]             HOME SLEEP STUDY   LOCATION: [facility]   Patient Name: [name] [name], [name] [name] Date: 10/25/2018 Gender: Male D.O.B: 1960-02-28 Age (years): 57 Referring Provider: Monico Blitz Height (inches): 9 Interpreting Physician: Phillips Odor MD, ABSM Weight (lbs): 202 RPSGT: Rosebud Poles BMI: 31 MRN: [id number] Neck Size: CLINICAL INFORMATION Sleep Study Type: HST     Indication for sleep study: Excessive Daytime Sleepiness     Epworth Sleepiness Score: NA  SLEEP STUDY TECHNIQUE A multi-channel overnight portable sleep study was performed. The channels recorded were: nasal airflow, thoracic respiratory movement, and oxygen saturation with a pulse oximetry. Snoring was also monitored.  MEDICATIONS Patient self administered medications include: N/A.  Current Outpatient Medications:  .  amoxicillin-clavulanate (AUGMENTIN) 875-125 MG tablet, Take 1 tablet by mouth every 12 (twelve) hours., Disp: 14 tablet, Rfl: 0 .  gabapentin (NEURONTIN) 300 MG capsule, Take 300 mg by mouth 3 (three) times daily., Disp: , Rfl:  .  lisinopril (ZESTRIL) 20 MG tablet, Take 20 mg by mouth daily., Disp: , Rfl:  .  ondansetron (ZOFRAN) 4 MG tablet, Take 1 tablet (4 mg total) by mouth every 8 (eight) hours as needed for nausea or vomiting., Disp: 10 tablet, Rfl: 0 .  oxyCODONE (ROXICODONE) 5 MG immediate release tablet, Take 1 tablet (5 mg total) by mouth every 4 (four) hours as needed for severe pain., Disp: 30 tablet, Rfl: 0 .  sennosides-docusate sodium (SENOKOT-S) 8.6-50 MG tablet, Take 2 tablets by mouth daily., Disp: 30 tablet, Rfl: 1 .  tiZANidine (ZANAFLEX) 4 MG tablet, Take 4 mg by mouth 3 (three) times daily., Disp: , Rfl:   SLEEP ARCHITECTURE Patient was studied for 434.9 minutes. The sleep efficiency was 90.6 % and the patient was supine for 66.6%. The arousal index was 0.0 per hour.  RESPIRATORY  PARAMETERS The overall AHI was 79.7 per hour, with a central apnea index of 4.4 per hour.  The oxygen nadir was 79% during sleep.     CARDIAC DATA Mean heart rate during sleep was 67.9 bpm.  IMPRESSIONS Severe obstructive sleep apnea occurred during this study (AHI = 79.7/h).  AUTOPAP 7-15 is recommended.  Delano Metz, MD Diplomate, American Board of Sleep Medicine.  ELECTRONICALLY SIGNED ON:  11/02/2018, 5:01 PM [place] PH: [phone]   FX: [phone] [place]

## 2020-04-02 ENCOUNTER — Emergency Department (HOSPITAL_COMMUNITY): Payer: 59

## 2020-04-02 ENCOUNTER — Other Ambulatory Visit: Payer: Self-pay

## 2020-04-02 ENCOUNTER — Encounter (HOSPITAL_COMMUNITY): Payer: Self-pay | Admitting: Emergency Medicine

## 2020-04-02 ENCOUNTER — Emergency Department (HOSPITAL_COMMUNITY)
Admission: EM | Admit: 2020-04-02 | Discharge: 2020-04-02 | Disposition: A | Discharge status: Home / Self Care | Payer: 59 | Attending: Emergency Medicine | Admitting: Emergency Medicine

## 2020-04-02 DIAGNOSIS — Z96642 Presence of left artificial hip joint: Secondary | ICD-10-CM | POA: Diagnosis not present

## 2020-04-02 DIAGNOSIS — I110 Hypertensive heart disease with heart failure: Secondary | ICD-10-CM | POA: Diagnosis not present

## 2020-04-02 DIAGNOSIS — D649 Anemia, unspecified: Secondary | ICD-10-CM | POA: Insufficient documentation

## 2020-04-02 DIAGNOSIS — I10 Essential (primary) hypertension: Secondary | ICD-10-CM

## 2020-04-02 DIAGNOSIS — Z79899 Other long term (current) drug therapy: Secondary | ICD-10-CM | POA: Insufficient documentation

## 2020-04-02 DIAGNOSIS — J449 Chronic obstructive pulmonary disease, unspecified: Secondary | ICD-10-CM | POA: Insufficient documentation

## 2020-04-02 DIAGNOSIS — F17210 Nicotine dependence, cigarettes, uncomplicated: Secondary | ICD-10-CM | POA: Insufficient documentation

## 2020-04-02 DIAGNOSIS — I509 Heart failure, unspecified: Secondary | ICD-10-CM | POA: Diagnosis not present

## 2020-04-02 DIAGNOSIS — R531 Weakness: Secondary | ICD-10-CM | POA: Insufficient documentation

## 2020-04-02 LAB — CBC WITH DIFFERENTIAL/PLATELET
Abs Immature Granulocytes: 0.05 10*3/uL (ref 0.00–0.07)
Basophils Absolute: 0.1 10*3/uL (ref 0.0–0.1)
Basophils Relative: 1 %
Eosinophils Absolute: 0.2 10*3/uL (ref 0.0–0.5)
Eosinophils Relative: 2 %
HCT: 39.3 % (ref 39.0–52.0)
Hemoglobin: 12.8 g/dL — ABNORMAL LOW (ref 13.0–17.0)
Immature Granulocytes: 1 %
Lymphocytes Relative: 25 %
Lymphs Abs: 2 10*3/uL (ref 0.7–4.0)
MCH: 31.7 pg (ref 26.0–34.0)
MCHC: 32.6 g/dL (ref 30.0–36.0)
MCV: 97.3 fL (ref 80.0–100.0)
Monocytes Absolute: 0.7 10*3/uL (ref 0.1–1.0)
Monocytes Relative: 9 %
Neutro Abs: 5 10*3/uL (ref 1.7–7.7)
Neutrophils Relative %: 62 %
Platelets: 224 10*3/uL (ref 150–400)
RBC: 4.04 MIL/uL — ABNORMAL LOW (ref 4.22–5.81)
RDW: 12.3 % (ref 11.5–15.5)
WBC: 8.1 10*3/uL (ref 4.0–10.5)
nRBC: 0 % (ref 0.0–0.2)

## 2020-04-02 LAB — COMPREHENSIVE METABOLIC PANEL
ALT: 13 U/L (ref 0–44)
AST: 15 U/L (ref 15–41)
Albumin: 3.7 g/dL (ref 3.5–5.0)
Alkaline Phosphatase: 46 U/L (ref 38–126)
Anion gap: 10 (ref 5–15)
BUN: 16 mg/dL (ref 6–20)
CO2: 24 mmol/L (ref 22–32)
Calcium: 8.7 mg/dL — ABNORMAL LOW (ref 8.9–10.3)
Chloride: 107 mmol/L (ref 98–111)
Creatinine, Ser: 1.06 mg/dL (ref 0.61–1.24)
GFR, Estimated: >60 mL/min (ref >60)
Glucose, Bld: 96 mg/dL (ref 70–99)
Potassium: 3.6 mmol/L (ref 3.5–5.1)
Sodium: 141 mmol/L (ref 135–145)
Total Bilirubin: 0.5 mg/dL (ref 0.3–1.2)
Total Protein: 6.3 g/dL — ABNORMAL LOW (ref 6.5–8.1)

## 2020-04-02 LAB — MAGNESIUM: Magnesium: 1.9 mg/dL (ref 1.7–2.4)

## 2020-04-02 LAB — URINALYSIS, ROUTINE W REFLEX MICROSCOPIC
Bilirubin Urine: NEGATIVE
Glucose, UA: NEGATIVE mg/dL
Hgb urine dipstick: NEGATIVE
Ketones, ur: NEGATIVE mg/dL
Leukocytes,Ua: NEGATIVE
Nitrite: NEGATIVE
Protein, ur: NEGATIVE mg/dL
Specific Gravity, Urine: 1.008 (ref 1.005–1.030)
pH: 7 (ref 5.0–8.0)

## 2020-04-02 LAB — TROPONIN I (HIGH SENSITIVITY): Troponin I (High Sensitivity): 4 ng/L (ref <18)

## 2020-04-02 NOTE — ED Triage Notes (Signed)
Pt states he wasn't feeling well so he had his wife check his BP and states it was 170's over 111. Pt with hx of CHF and states he has noticed that he has been "more fatigued lately".

## 2020-04-02 NOTE — ED Provider Notes (Signed)
[facility] Provider Note   CSN: [id number] Arrival date & time: 04/02/20  0006   History Chief Complaint  Patient presents with  . Hypertension    [name] [name] is a 60 y.o. male.  The history is provided by the patient.  Hypertension  He has history of hypertension, COPD, sleep apnea and comes in because of blood pressure being elevated at home.  He noted that he was feeling very weak and his wife checked his blood pressure and found it was 174/111.  He denies headache, tinnitus, epistaxis.  Blood pressure was taken several times at home and it was not coming down.  He denies chest pain, heaviness, tightness, pressure.  He does complain of some interscapular pain which has been present for several days.  He denies nausea, vomiting.  He also checked his heart rate and noted that it was low at 56.  He states that he has been told that he has congestive heart failure although he states he had an echocardiogram which was normal.  He is scheduled to see a cardiologist.  He takes lisinopril for his blood pressure.  He is not on any diuretics or beta blockers.  He was recently started on meloxicam for joint pain.  Past Medical History:  Diagnosis Date  . Full thickness tear of left subscapularis tendon 09/05/2018  . Hypertension   . Sleep apnea 1996   does not wear CPAP    Patient Active Problem List   Diagnosis Date Noted  . Pneumonia 09/06/2018  . COPD (chronic obstructive pulmonary disease) ([place]) 09/06/2018  . Adrenal adenoma, right 09/06/2018  . OSA (obstructive sleep apnea) 09/06/2018  . Essential hypertension 09/06/2018  . Indigestion 09/06/2018  . Full thickness tear of left subscapularis tendon 09/05/2018    Past Surgical History:  Procedure Laterality Date  . CARDIAC CATHETERIZATION  2010  . JOINT REPLACEMENT    . left hip replacement  1996  . SHOULDER ARTHROSCOPY WITH ROTATOR CUFF REPAIR AND SUBACROMIAL DECOMPRESSION Left 09/05/2018    Procedure: LEFT SHOULDER ARTHROSCOPY WITH SUBSCAPULARIS AND SUBACROMIAL DECOMPRESSION, BICEP TENODESIS, EXTENSIVE DEBRIDEMENT;  Surgeon: Marchia Bond, MD;  Location: [facility];  Service: Orthopedics;  Laterality: Left;       History reviewed. No pertinent family history.  Social History   Tobacco Use  . Smoking status: Current Every Day Smoker    Packs/day: 0.50    Years: 41.00    Pack years: 20.50  . Smokeless tobacco: Never Used  Vaping Use  . Vaping Use: Never used  Substance Use Topics  . Alcohol use: Never  . Drug use: Never    Home Medications Prior to Admission medications   Medication Sig Start Date End Date Taking? Authorizing Provider  amoxicillin-clavulanate (AUGMENTIN) 875-125 MG tablet Take 1 tablet by mouth every 12 (twelve) hours. 09/06/18   Hayden Rasmussen, MD  gabapentin (NEURONTIN) 300 MG capsule Take 300 mg by mouth 3 (three) times daily.    [provider]  lisinopril (ZESTRIL) 20 MG tablet Take 20 mg by mouth daily.    [provider]  ondansetron (ZOFRAN) 4 MG tablet Take 1 tablet (4 mg total) by mouth every 8 (eight) hours as needed for nausea or vomiting. 09/05/18   Marchia Bond, MD  oxyCODONE (ROXICODONE) 5 MG immediate release tablet Take 1 tablet (5 mg total) by mouth every 4 (four) hours as needed for severe pain. 09/05/18   Marchia Bond, MD  sennosides-docusate sodium (SENOKOT-S) 8.6-50 MG tablet  Take 2 tablets by mouth daily. 09/05/18   Marchia Bond, MD  tiZANidine (ZANAFLEX) 4 MG tablet Take 4 mg by mouth 3 (three) times daily.    [provider]    Allergies    Hydrocodone  Review of Systems   Review of Systems  All other systems reviewed and are negative.   Physical Exam Updated Vital Signs BP (!) 154/103   Pulse (!) 54   Temp 98.1 F (36.7 C) (Oral)   Resp 18   Ht 5\' 10"  (1.778 m)   Wt 86 kg   SpO2 96%   BMI 27.20 kg/m   Physical Exam Vitals and nursing note reviewed.   60 year  old male, resting comfortably and in no acute distress. Vital signs are significant for elevated blood pressure and slightly low heart rate. Oxygen saturation is 96%, which is normal. Head is normocephalic and atraumatic. PERRLA, EOMI. Oropharynx is clear. Neck is nontender and supple without adenopathy or JVD. Back is nontender and there is no CVA tenderness. Lungs are clear without rales, wheezes, or rhonchi. Chest is nontender. Heart has regular rate and rhythm without murmur. Abdomen is soft, flat, nontender without masses or hepatosplenomegaly and peristalsis is normoactive. Extremities have 1+ edema, full range of motion is present. Skin is warm and dry without rash. Neurologic: Mental status is normal, cranial nerves are intact, there are no motor or sensory deficits.  ED Results / Procedures / Treatments   Labs (all labs ordered are listed, but only abnormal results are displayed) Labs Reviewed  COMPREHENSIVE METABOLIC PANEL - Abnormal; Notable for the following components:      Result Value   Calcium 8.7 (*)    Total Protein 6.3 (*)    All other components within normal limits  CBC WITH DIFFERENTIAL/PLATELET - Abnormal; Notable for the following components:   RBC 4.04 (*)    Hemoglobin 12.8 (*)    All other components within normal limits  URINALYSIS, ROUTINE W REFLEX MICROSCOPIC - Abnormal; Notable for the following components:   Color, Urine STRAW (*)    All other components within normal limits  MAGNESIUM  TROPONIN I (HIGH SENSITIVITY)   Radiology DG Chest Port 1 View  Result Date: 04/02/2020 CLINICAL DATA:  Weakness EXAM: PORTABLE CHEST 1 VIEW COMPARISON:  09/06/2018 FINDINGS: Bullous disease within the right apex is unchanged. Lungs are otherwise clear. No pneumothorax or pleural effusion. Cardiac size within normal limits. Pulmonary vascularity is normal. No acute bone abnormality. IMPRESSION: No active disease. Electronically Signed   By: Fidela Salisbury MD   On:  04/02/2020 01:46    Procedures Procedures   Medications Ordered in ED Medications - No data to display  ED Course  I have reviewed the triage vital signs and the nursing notes.  Pertinent labs & imaging results that were available during my care of the patient were reviewed by me and considered in my medical decision making (see chart for details).  MDM Rules/Calculators/A&P Elevated blood pressure in patient with known history of hypertension.  Borderline bradycardia of doubtful clinical significance.  Sense of weakness of uncertain cause.  Old records are reviewed, and it is noted that he saw a spine specialist on 3/8 at which time blood pressure was 174/103, and he was prescribed meloxicam for hip pain.  Blood pressure at office visits over the last year have generally been elevated.  He likely will need to have his antihypertensive regimen adjusted, but I do not see a need for emergent changes.  Will check screening labs to look for other reasons he might have felt weak.  Labs show mild anemia, which is not severe enough to cause significant weakness.  However, this is something new compared with prior.  Chest x-ray is unremarkable.  Repeat blood pressure readings continue to be mildly to moderately elevated.  Patient is advised to continue monitoring his blood pressure at home and keep a record of it and take that record with him when he sees any of his treating physicians.  Urinalysis is still pending, the patient is anxious to leave so he is discharged and he will be contacted if urinalysis shows need to start antibiotics.  Urinalysis has come back normal.  Final Clinical Impression(s) / ED Diagnoses Final diagnoses:  Weakness  Elevated blood pressure reading with diagnosis of hypertension  Normochromic normocytic anemia    Rx / DC Orders ED Discharge Orders    None       Delora Fuel, MD 72/89/79 [phone]

## 2020-04-02 NOTE — Discharge Instructions (Addendum)
Your evaluation today showed a very mild anemia, but nothing else that would contribute to your weakness.  Your blood pressure is elevated, but not so high that it needs emergent treatment tonight.  Please take your blood pressure once or twice a day and keep a record of it.  Take that record with you whenever you see your primary care provider or your cardiologist.  They may make changes in your medications based on the blood pressure readings you take with you.  Return to the emergency department if you have any new or concerning symptoms.

## 2020-04-05 ENCOUNTER — Other Ambulatory Visit: Payer: Self-pay

## 2020-04-05 ENCOUNTER — Encounter: Payer: Self-pay | Admitting: Cardiology

## 2020-04-05 ENCOUNTER — Ambulatory Visit: Payer: 59 | Admitting: Cardiology

## 2020-04-05 VITALS — BP 127/84 | HR 66 | Temp 98.5°F | Resp 17 | Ht 70.0 in | Wt 172.4 lb

## 2020-04-05 DIAGNOSIS — D3501 Benign neoplasm of right adrenal gland: Secondary | ICD-10-CM

## 2020-04-05 DIAGNOSIS — J432 Centrilobular emphysema: Secondary | ICD-10-CM

## 2020-04-05 DIAGNOSIS — I10 Essential (primary) hypertension: Secondary | ICD-10-CM

## 2020-04-05 DIAGNOSIS — G4733 Obstructive sleep apnea (adult) (pediatric): Secondary | ICD-10-CM

## 2020-04-05 DIAGNOSIS — R0789 Other chest pain: Secondary | ICD-10-CM

## 2020-04-05 DIAGNOSIS — R0600 Dyspnea, unspecified: Secondary | ICD-10-CM

## 2020-04-05 DIAGNOSIS — F17200 Nicotine dependence, unspecified, uncomplicated: Secondary | ICD-10-CM

## 2020-04-05 DIAGNOSIS — R0609 Other forms of dyspnea: Secondary | ICD-10-CM

## 2020-04-05 MED ORDER — AMLODIPINE BESYLATE 5 MG PO TABS: 5.0000 mg | ORAL_TABLET | Freq: Every day | ORAL | 2 refills | Status: DC

## 2020-04-05 NOTE — Progress Notes (Unsigned)
Primary Physician/Referring:  Monico Blitz, MD  Patient ID: [name] [name], male    DOB: [birth year], 60 y.o.   MRN: [id number]  Chief Complaint  Patient presents with  . New Patient (Initial Visit)  . Chest Pain    Ref by Monico Blitz, MD   HPI:    [name] [name]  is a 60 y.o. Caucasian male patient with heavy tobacco use disorder (>41-pack-year history), COPD with centrilobular and paraseptal emphysema, disabled due to spinal stenosis and chronic back pain and COPD, obstructive sleep apnea on BiPAP, hypertension referred to me for evaluation of chest pain.  Patient has chronic severe dyspnea due to underlying COPD and unfortunately is still smoking a pack of cigarettes a day.  His lifestyle is markedly limited due to severe back pain and underlying COPD.  He was seen in the emergency room a few days ago when he presented with markedly elevated blood pressure, diastolic blood pressure greater than 100 mmHg, was reassured sent home with recommendation to follow-up with PCP.  States that his blood pressure tends to increase significantly in the evenings but is relatively normal during the daytime.  Chest pain is described as sharp pain in the middle of the chest.  Comes on with any activity, but also present at rest and last a few minute.  Sometimes he has to stop doing his activity due to pain.  However his activity level is walking within the house, in the driveway and picking up mail.  He denies leg edema, PND or orthopnea.  Past Medical History:  Diagnosis Date  . Full thickness tear of left subscapularis tendon 09/05/2018  . Hypertension   . Sleep apnea 1996   does not wear CPAP   Past Surgical History:  Procedure Laterality Date  . CARDIAC CATHETERIZATION  2010  . JOINT REPLACEMENT    . left hip replacement  1996  . SHOULDER ARTHROSCOPY WITH ROTATOR CUFF REPAIR AND SUBACROMIAL DECOMPRESSION Left 09/05/2018   Procedure: LEFT SHOULDER ARTHROSCOPY WITH SUBSCAPULARIS AND  SUBACROMIAL DECOMPRESSION, BICEP TENODESIS, EXTENSIVE DEBRIDEMENT;  Surgeon: Marchia Bond, MD;  Location: [facility];  Service: Orthopedics;  Laterality: Left;   Family History  Problem Relation Age of Onset  . Congestive Heart Failure Mother   . Scoliosis Mother   . High blood pressure Mother   . Lung cancer Father   . COPD Father   . Diabetes Father   . Gout Father   . Emphysema Father   . Cirrhosis Brother   . Cirrhosis Brother     Social History   Tobacco Use  . Smoking status: Current Every Day Smoker    Packs/day: 1.00    Years: 41.00    Pack years: 41.00    Types: Cigarettes  . Smokeless tobacco: Never Used  Substance Use Topics  . Alcohol use: Never   Marital Status: Married  ROS  Review of Systems  Cardiovascular: Positive for chest pain. Negative for leg swelling.  Respiratory: Positive for shortness of breath and wheezing.   Musculoskeletal: Positive for arthritis, back pain and neck pain.  Gastrointestinal: Negative.  Negative for melena.  Genitourinary: Negative.    Objective  Blood pressure 127/84, pulse 66, temperature 98.5 F (36.9 C), temperature source Temporal, resp. rate 17, height '5\' 10"'  (1.778 m), weight 172 lb 6.4 oz (78.2 kg), SpO2 98 %.  Vitals with BMI 04/05/2020 04/02/2020 04/02/2020  Height '5\' 10"'  - -  Weight 172 lbs 6 oz - -  BMI 24.74 - -  Systolic 196 222 979  Diastolic 84 892 119  Pulse 66 72 65     Physical Exam Constitutional:      Appearance: Normal appearance.  Cardiovascular:     Rate and Rhythm: Normal rate and regular rhythm.     Pulses: Intact distal pulses.     Heart sounds: Normal heart sounds. No murmur heard. No gallop.      Comments: No leg edema, no JVD. Pulmonary:     Effort: Pulmonary effort is normal.     Breath sounds: Wheezing (bilateral scattered) and rales (bilateral scattered) present.  Abdominal:     General: Bowel sounds are normal.     Palpations: Abdomen is soft.  Skin:    General:  Skin is warm and dry.  Neurological:     General: No focal deficit present.     Mental Status: He is oriented to person, place, and time.    Laboratory examination:   Recent Labs    04/02/20 0155  NA 141  K 3.6  CL 107  CO2 24  GLUCOSE 96  BUN 16  CREATININE 1.06  CALCIUM 8.7*  GFRNONAA >60   estimated creatinine clearance is 79.4 mL/min (by C-G formula based on SCr of 1.06 mg/dL).  CMP Latest Ref Rng & Units 04/02/2020 09/06/2018  Glucose 70 - 99 mg/dL 96 112(H)  BUN 6 - 20 mg/dL 16 17  Creatinine 0.61 - 1.24 mg/dL 1.06 0.90  Sodium 135 - 145 mmol/L 141 139  Potassium 3.5 - 5.1 mmol/L 3.6 4.1  Chloride 98 - 111 mmol/L 107 102  CO2 22 - 32 mmol/L 24 -  Calcium 8.9 - 10.3 mg/dL 8.7(L) -  Total Protein 6.5 - 8.1 g/dL 6.3(L) -  Total Bilirubin 0.3 - 1.2 mg/dL 0.5 -  Alkaline Phos 38 - 126 U/L 46 -  AST 15 - 41 U/L 15 -  ALT 0 - 44 U/L 13 -   CBC Latest Ref Rng & Units 04/02/2020 09/06/2018 09/06/2018  WBC 4.0 - 10.5 K/uL 8.1 - 15.3(H)  Hemoglobin 13.0 - 17.0 g/dL 12.8(L) 13.9 14.0  Hematocrit 39.0 - 52.0 % 39.3 41.0 41.2  Platelets 150 - 400 K/uL 224 - 266    External labs:   Labs 02/04/2020:  Total cholesterol 163, triglycerides 60, HDL 42, LDL 109.  TSH normal.  Hb 13.3/HCT 39.5, platelets 172, normal indicis.  Serum glucose 104 mg, BUN 13, creatinine 1.08, EGFR 76 mL, CMP otherwise normal.  Uric acid 5.4.  Medications and allergies   Allergies  Allergen Reactions  . Hydrocodone     hallucinations     Outpatient Medications Prior to Visit  Medication Sig  . albuterol (VENTOLIN HFA) 108 (90 Base) MCG/ACT inhaler Inhale 1 puff into the lungs as needed.  . budesonide-formoterol (SYMBICORT) 160-4.5 MCG/ACT inhaler Inhale 1 puff into the lungs as needed.  . busPIRone (BUSPAR) 10 MG tablet Take 10 mg by mouth 3 (three) times daily as needed.  . gabapentin (NEURONTIN) 300 MG capsule Take 300 mg by mouth 3 (three) times daily.  Marland Kitchen lisinopril (ZESTRIL) 20 MG  tablet Take 20 mg by mouth daily.  . meloxicam (MOBIC) 15 MG tablet Take 15 mg by mouth daily.  Marland Kitchen omeprazole (PRILOSEC) 40 MG capsule Take 40 mg by mouth daily.  Marland Kitchen tiZANidine (ZANAFLEX) 4 MG tablet Take 4 mg by mouth 3 (three) times daily.  . [DISCONTINUED] ondansetron (ZOFRAN) 4 MG tablet Take 1 tablet (4 mg total) by mouth every 8 (eight) hours as  needed for nausea or vomiting.  . [DISCONTINUED] oxyCODONE (ROXICODONE) 5 MG immediate release tablet Take 1 tablet (5 mg total) by mouth every 4 (four) hours as needed for severe pain.  . [DISCONTINUED] sennosides-docusate sodium (SENOKOT-S) 8.6-50 MG tablet Take 2 tablets by mouth daily.   No facility-administered medications prior to visit.    Radiology:    Chest x-ray PA and lateral view 04/02/2020: Bullous disease within the right apex is unchanged. Lungs are otherwise clear. No pneumothorax or pleural effusion. Cardiac size within normal limits. Pulmonary vascularity is normal. No acute bone abnormality.  CT angio chest 09/06/2018: 1. Cardiovascular: There is no cardiomegaly or pericardial effusion. The thoracic aorta is unremarkable.  The origins of the great vessels of the aortic arch are patent as visualized. There is no pulmonary artery embolism. 2. Lungs/Pleura: There is moderate centrilobular and paraseptal emphysema. Patchy areas of consolidative change in the left lower lobe and lingula noted which may represent atelectasis or infiltrate. Clinical correlation is recommended. Right lung base linear atelectasis/scarring. There is no pleural effusion or pneumothorax. The central airways are patent. 3. Right adrenal adenoma. 4. Soft tissue air in the left subpectoral region and base of the left side of the neck likely related to recent surgery. No drainable fluid collection.  Cardiac Studies:   Echocardiogram performed at PCPs office 03/22/2020: Normal LV size and systolic function, EF 71%.  Mild diastolic dysfunction without  elevation in left atrial pressure. Left atrium is mild to moderately dilated. Right atrium is mildly dilated. Aortic root is mildly dilated at 3.95. Mild tricuspid regurgitation, PA pressure estimated at 24 mmHg, RA =8 mmHg. IVC is dilated with normal respiratory variation. EKG:     EKG 04/05/2020: Normal sinus rhythm at rate of 62 bpm, left atrial enlargement, normal axis, incomplete right bundle branch block.  No evidence of ischemia.     Assessment     ICD-10-CM   1. Atypical chest pain  R07.89 EKG 12-Lead    PCV MYOCARDIAL PERFUSION WITH LEXISCAN  2. Dyspnea on exertion  R06.00   3. Primary hypertension  I10 amLODipine (NORVASC) 5 MG tablet  4. Centrilobular emphysema ([place])  J43.2   5. Tobacco use disorder  F17.200   6. Adenoma of right adrenal gland  D35.01   7. OSA (obstructive sleep apnea)  G47.33      Medications Discontinued During This Encounter  Medication Reason  . ondansetron (ZOFRAN) 4 MG tablet Error  . oxyCODONE (ROXICODONE) 5 MG immediate release tablet Error  . sennosides-docusate sodium (SENOKOT-S) 8.6-50 MG tablet Error    Meds ordered this encounter  Medications  . amLODipine (NORVASC) 5 MG tablet    Sig: Take 1 tablet (5 mg total) by mouth daily in the afternoon.    Dispense:  30 tablet    Refill:  2   Orders Placed This Encounter  Procedures  . PCV MYOCARDIAL PERFUSION WITH LEXISCAN    Standing Status:   Future    Standing Expiration Date:   06/05/2020  . EKG 12-Lead   Recommendations:   DAMAN STEFFENHAGEN is a 60 y.o. Caucasian male patient with heavy tobacco use disorder (>41-pack-year history), COPD with centrilobular and paraseptal emphysema, disabled due to spinal stenosis and chronic back pain and COPD, obstructive sleep apnea on BiPAP, hypertension referred to me for evaluation of chest pain.  Although his blood pressure today is well controlled, patient states that in the evening it shoots up fairly high.  I started him on amlodipine 5 mg  in the evening.  Dyspnea is probably related to underlying severe COPD.  There is no clinical evidence of heart failure.  In spite of his risk factors, his cardiac examination itself appears normal including vascular exam.  Surprisingly, episodic and paroxysmal increase in blood pressure also may suggest adrenal adenoma.  CT scan performed in 2020 had revealed presence of right adrenal adenoma.  If you persist to have uncontrolled hypertension or paroxysms of elevated blood pressure, he will need dedicated study to evaluate for adrenal adenoma and will also need aldosterone: Renin ratio which I will obtain on his next visit as these records were finished post visit in view of extensive medical records and history.  Reviewed his lipids, mildly elevated lipids but in view of heavy tobacco use disorder may benefit from low intensity low-dose statin which I will address on his next office visit.  Schedule for a Lexiscan Nuclear stress test to evaluate for myocardial ischemia. Patient unable to do treadmill stress testing due to back pain, arthritis and dyspnea.  I reviewed his echocardiogram, suggestion of aortic root dilatation however the CT angiogram although not indicated of the chest done in 2020 was essentially unremarkable with regard to cardiac structures.  However in the repeat echocardiogram in a year would be appropriate and can be performed by his PCP.  Smoking cessation discussed with the patient and his wife who is also a smoker.  He has a diagnosis of obstructive sleep apnea but does not.  Wearing CPAP, again will discuss on his next office visit.  Office visit following the work-up/investigations.  This was a 60-minute office visit encounter.    Adrian Prows, MD, [facility] 04/07/2020, 6:00 AM Office: [phone]

## 2020-04-14 ENCOUNTER — Ambulatory Visit: Payer: 59

## 2020-04-14 ENCOUNTER — Other Ambulatory Visit: Payer: Self-pay

## 2020-04-14 DIAGNOSIS — R0789 Other chest pain: Secondary | ICD-10-CM

## 2020-04-18 NOTE — Progress Notes (Signed)
You may want to see him sooner for abnormal stress and risk management. May need cath

## 2020-04-19 NOTE — Progress Notes (Signed)
Please advice patient stress test revealed abnormalities, would like to see him sooner than May to discuss further. Please set him up for appt some time this week.

## 2020-04-20 ENCOUNTER — Telehealth: Payer: Self-pay

## 2020-04-20 NOTE — Progress Notes (Signed)
No answer and wasn't able to leave vm

## 2020-04-21 NOTE — Progress Notes (Signed)
Called and spoke to pt regarding stress test results. Pt voiced understanding and appt has been moved to 04/26/2020.

## 2020-04-23 NOTE — Progress Notes (Signed)
Primary Physician/Referring:  Monico Blitz, MD  Patient ID: [name] [name], male    DOB: [birth year], 60 y.o.   MRN: [id number]  Chief Complaint  Patient presents with  . Chest Pain  . Shortness of Breath  . Hypertension  . Follow-up    6 week  . Dizziness   HPI:    [name] [name]  is a 60 y.o. Caucasian male patient with heavy tobacco use disorder (>41-pack-year history), COPD with centrilobular and paraseptal emphysema, disabled due to spinal stenosis and chronic back pain and COPD with severe chronic dyspnea, obstructive sleep apnea on BiPAP, hypertension originally referred for evaluation of chest pain.  Patient reports he has had 2 cardiac catheter in the remote past approximately 15-18 years ago.  He reports no stent implantation, but recalls being told he had small vessel disease.  Patient presents for urgent visit to discuss abnormal stress test results as well as follow-up on chest pain.  Recent nuclear stress test revealed mild reversible ischemia in the apical lateral wall and mid inferolateral wall of left ventricle.  At last visit started patient on amlodipine 5 mg daily in the evening, however since last visit patient has been seen by PCP who in view of recent abnormal stress test discontinued amlodipine and pain and started patient on metoprolol succinate 25 mg daily.  Patient's blood pressure remains elevated above goal, and he brings with him a written blood pressure log which reveals episodic significant increases in blood pressure.  He is tolerating metoprolol without issue. Patient's PCP has also started him on aspirin 81 mg daily.  Patient continues to have dyspnea on exertion and occasional episodes of brief sharp chest pain.  He has no active chest pain presently.  He also has mild bilateral lower leg edema.  Patient denies palpitations, syncope, near syncope, PND, orthopnea.  Past Medical History:  Diagnosis Date  . Full thickness tear of left subscapularis  tendon 09/05/2018  . Hypertension   . Sleep apnea 1996   does not wear CPAP   Past Surgical History:  Procedure Laterality Date  . CARDIAC CATHETERIZATION  2010  . JOINT REPLACEMENT    . left hip replacement  1996  . SHOULDER ARTHROSCOPY WITH ROTATOR CUFF REPAIR AND SUBACROMIAL DECOMPRESSION Left 09/05/2018   Procedure: LEFT SHOULDER ARTHROSCOPY WITH SUBSCAPULARIS AND SUBACROMIAL DECOMPRESSION, BICEP TENODESIS, EXTENSIVE DEBRIDEMENT;  Surgeon: Marchia Bond, MD;  Location: [facility];  Service: Orthopedics;  Laterality: Left;   Family History  Problem Relation Age of Onset  . Congestive Heart Failure Mother   . Scoliosis Mother   . High blood pressure Mother   . Lung cancer Father   . COPD Father   . Diabetes Father   . Gout Father   . Emphysema Father   . Cirrhosis Brother   . Cirrhosis Brother     Social History   Tobacco Use  . Smoking status: Current Every Day Smoker    Packs/day: 1.00    Years: 41.00    Pack years: 41.00    Types: Cigarettes  . Smokeless tobacco: Never Used  Substance Use Topics  . Alcohol use: Never   Marital Status: Married  ROS  Review of Systems  Constitutional: Negative for malaise/fatigue and weight gain.  Cardiovascular: Positive for chest pain. Negative for claudication, leg swelling, near-syncope, orthopnea, palpitations, paroxysmal nocturnal dyspnea and syncope.  Respiratory: Positive for shortness of breath and wheezing.   Hematologic/Lymphatic: Does not bruise/bleed easily.  Musculoskeletal: Positive for arthritis, back  pain and neck pain.  Gastrointestinal: Negative.  Negative for melena.  Genitourinary: Negative.   Neurological: Negative for dizziness and weakness.   Objective  Blood pressure (!) 141/96, pulse 60, temperature 98.3 F (36.8 C), resp. rate 17, height _0  (1.778 m), weight 184 lb 9.6 oz (83.7 kg), SpO2 96 %.  Vitals with BMI 04/26/2020 04/26/2020 04/05/2020  Height - _1  _2   Weight - 184 lbs 10  oz 172 lbs 6 oz  BMI - 17.00 17.49  Systolic 449 675 916  Diastolic 96 91 84  Pulse 60 61 66     Physical Exam Constitutional:      Appearance: Normal appearance.  Cardiovascular:     Rate and Rhythm: Normal rate and regular rhythm.     Pulses: Intact distal pulses.     Heart sounds: Normal heart sounds. No murmur heard. No gallop.      Comments: No leg edema, no JVD. Pulmonary:     Effort: Pulmonary effort is normal.     Breath sounds: Wheezing (bilateral scattered) and rales (bilateral scattered) present.  Abdominal:     General: Bowel sounds are normal.     Palpations: Abdomen is soft.  Musculoskeletal:     Right lower leg: Edema (1+ pitting) present.     Left lower leg: Edema (1+ pitting) present.  Skin:    General: Skin is warm and dry.  Neurological:     General: No focal deficit present.     Mental Status: He is oriented to person, place, and time.    Laboratory examination:   Recent Labs    04/02/20 0155  NA 141  K 3.6  CL 107  CO2 24  GLUCOSE 96  BUN 16  CREATININE 1.06  CALCIUM 8.7*  GFRNONAA >60   CrCl cannot be calculated (Patient's most recent lab result is older than the maximum 21 days allowed.).  CMP Latest Ref Rng & Units 04/02/2020 09/06/2018  Glucose 70 - 99 mg/dL 96 112(H)  BUN 6 - 20 mg/dL 16 17  Creatinine 0.61 - 1.24 mg/dL 1.06 0.90  Sodium 135 - 145 mmol/L 141 139  Potassium 3.5 - 5.1 mmol/L 3.6 4.1  Chloride 98 - 111 mmol/L 107 102  CO2 22 - 32 mmol/L 24 -  Calcium 8.9 - 10.3 mg/dL 8.7(L) -  Total Protein 6.5 - 8.1 g/dL 6.3(L) -  Total Bilirubin 0.3 - 1.2 mg/dL 0.5 -  Alkaline Phos 38 - 126 U/L 46 -  AST 15 - 41 U/L 15 -  ALT 0 - 44 U/L 13 -   CBC Latest Ref Rng & Units 04/02/2020 09/06/2018 09/06/2018  WBC 4.0 - 10.5 K/uL 8.1 - 15.3(H)  Hemoglobin 13.0 - 17.0 g/dL 12.8(L) 13.9 14.0  Hematocrit 39.0 - 52.0 % 39.3 41.0 41.2  Platelets 150 - 400 K/uL 224 - 266    External labs:   02/04/2020: Total cholesterol 163, triglycerides  60, HDL 42, LDL 109. TSH normal. Hb 13.3/HCT 39.5, platelets 172, normal indicis. Serum glucose 104 mg, BUN 13, creatinine 1.08, EGFR 76 mL, CMP otherwise normal. Uric acid 5.4.  Medications and allergies   Allergies  Allergen Reactions  . Hydrocodone     hallucinations     Outpatient Medications Prior to Visit  Medication Sig  . albuterol (VENTOLIN HFA) 108 (90 Base) MCG/ACT inhaler Inhale 1 puff into the lungs as needed.  Marland Kitchen aspirin 81 MG chewable tablet Chew by mouth daily.  . budesonide-formoterol (SYMBICORT) 160-4.5 MCG/ACT inhaler Inhale  1 puff into the lungs as needed.  . busPIRone (BUSPAR) 10 MG tablet Take 10 mg by mouth 3 (three) times daily as needed.  . gabapentin (NEURONTIN) 300 MG capsule Take 300 mg by mouth 3 (three) times daily.  Marland Kitchen lisinopril (ZESTRIL) 20 MG tablet Take 20 mg by mouth daily.  . meloxicam (MOBIC) 15 MG tablet Take 15 mg by mouth daily.  Marland Kitchen omeprazole (PRILOSEC) 40 MG capsule Take 40 mg by mouth daily.  Marland Kitchen tiZANidine (ZANAFLEX) 4 MG tablet Take 4 mg by mouth 3 (three) times daily.  . metoprolol succinate (TOPROL-XL) 25 MG 24 hr tablet Take 1 tablet by mouth daily.  . [DISCONTINUED] amLODipine (NORVASC) 5 MG tablet Take 1 tablet (5 mg total) by mouth daily in the afternoon.   No facility-administered medications prior to visit.    Radiology:    Chest x-ray PA and lateral view 04/02/2020: Bullous disease within the right apex is unchanged. Lungs are otherwise clear. No pneumothorax or pleural effusion. Cardiac size within normal limits. Pulmonary vascularity is normal. No acute bone abnormality.  CT angio chest 09/06/2018: 1. Cardiovascular: There is no cardiomegaly or pericardial effusion. The thoracic aorta is unremarkable.  The origins of the great vessels of the aortic arch are patent as visualized. There is no pulmonary artery embolism. 2. Lungs/Pleura: There is moderate centrilobular and paraseptal emphysema. Patchy areas of consolidative  change in the left lower lobe and lingula noted which may represent atelectasis or infiltrate. Clinical correlation is recommended. Right lung base linear atelectasis/scarring. There is no pleural effusion or pneumothorax. The central airways are patent. 3. Right adrenal adenoma. 4. Soft tissue air in the left subpectoral region and base of the left side of the neck likely related to recent surgery. No drainable fluid collection.  Cardiac Studies:   PCV MYOCARDIAL PERFUSION WITH LEXISCAN 04/14/2020 Nondiagnostic ECG stress. Moderate degree medium extent perfusion defect consistent with mild (reversible) ischemia located in the apical lateral wall and mid inferolateral wall (Left Circumflex Artery region) of left ventricle. Overall LV systolic function is normal with regional wall motion abnormalities. Stress LV EF: 44%. No previous exam available for comparison. Intermediate risk study.  Echocardiogram performed at PCPs office 03/22/2020: Normal LV size and systolic function, EF 25%.  Mild diastolic dysfunction without elevation in left atrial pressure. Left atrium is mild to moderately dilated. Right atrium is mildly dilated. Aortic root is mildly dilated at 3.95. Mild tricuspid regurgitation, PA pressure estimated at 24 mmHg, RA =8 mmHg. IVC is dilated with normal respiratory variation.  EKG:   EKG 04/05/2020: Normal sinus rhythm at rate of 62 bpm, left atrial enlargement, normal axis, incomplete right bundle branch block.  No evidence of ischemia.     Assessment     ICD-10-CM   1. Atypical chest pain  R07.89   2. Dyspnea on exertion  R06.00   3. Adenoma of right adrenal gland  D35.01 Aldosterone + renin activity w/ ratio    CT ADRENAL ABD WO  4. Primary hypertension  I10 amLODipine (NORVASC) 5 MG tablet    Aldosterone + renin activity w/ ratio    CT ADRENAL ABD WO  5. Abnormal stress test  R94.39 CBC    Basic metabolic panel    Novel Coronavirus, NAA ([facility])      Medications Discontinued During This Encounter  Medication Reason  . amLODipine (NORVASC) 5 MG tablet Error    Meds ordered this encounter  Medications  . atorvastatin (LIPITOR) 40 MG tablet  Sig: Take 1 tablet (40 mg total) by mouth daily.    Dispense:  90 tablet    Refill:  3  . amLODipine (NORVASC) 5 MG tablet    Sig: Take 1 tablet (5 mg total) by mouth every evening.    Dispense:  30 tablet    Refill:  2   Orders Placed This Encounter  Procedures  . Novel Coronavirus, NAA ([facility])    Order Specific Question:   Is this test for diagnosis or screening    Answer:   Screening    Order Specific Question:   Symptomatic for COVID-19 as defined by CDC    Answer:   No    Order Specific Question:   Hospitalized for COVID-19    Answer:   No    Order Specific Question:   Admitted to ICU for COVID-19    Answer:   No    Order Specific Question:   Previously tested for COVID-19    Answer:   Yes    Order Specific Question:   Resident in a congregate (group) care setting    Answer:   No    Order Specific Question:   Is the patient student?    Answer:   No    Order Specific Question:   Employed in healthcare setting    Answer:   No    Order Specific Question:   Has patient completed COVID vaccination(s) (2 doses of Pfizer/Moderna 1 dose of [employer])    Answer:   Yes    Order Specific Question:   Has patient completed COVID Booster / 3rd dose    Answer:   No  . CT ADRENAL ABD WO    Standing Status:   Future    Standing Expiration Date:   04/26/2021    Order Specific Question:   Preferred imaging location?    Answer:   [facility]    Order Specific Question:   Is Oral Contrast requested for this exam?    Answer:   Yes, Per Radiology protocol  . CBC  . Basic metabolic panel  . Aldosterone + renin activity w/ ratio   Recommendations:   ANTONIS LOR is a 60 y.o. Caucasian male patient with heavy tobacco use disorder (>41-pack-year history), COPD with  centrilobular and paraseptal emphysema, disabled due to spinal stenosis and chronic back pain and COPD, obstructive sleep apnea on BiPAP, hypertension originally referred for evaluation of chest pain.  Patient presents for urgent visit to discuss abnormal stress test results as well as follow-up on chest pain.  Recent nuclear stress test revealed mild reversible ischemia in the apical lateral wall and mid inferolateral wall of left ventricle.  As patient's blood pressure remains elevated with metoprolol succinate, Will restart him on amlodipine 5 mg daily.  Patient will continue to monitor his blood pressure and notify and notify our office of episodes of hypotension.  Patient does continue to have paroxysmal A. fib paroxysmal increases in blood pressure which may be related to adrenal adenoma.  Will investigate further, obtain renin aldosterone ratio as well as CT adrenal abdomen.  Reviewed and discussed with patient and his wife who is present at bedside regarding results of stress testing.  In view of abnormal stress test and elevated LDL on last lipid panel, will start patient on atorvastatin 40 mg daily.  Also discussed with patient management options including proceeding with cardiac catheterization. The left heart catheterization procedure was explained to the patient in detail. The  indication, alternatives, risks and benefits were reviewed. Complications including but not limited to bleeding, infection, acute kidney injury, blood transfusion, heart rhythm disturbances, contrast (dye) reaction, damage to the arteries or nerves in the legs or hands, cerebrovascular accident, myocardial infarction, need for emergent bypass surgery, blood clots in the legs, possible need for emergent blood transfusion, and rarely death were reviewed and discussed with the patient. The patient voices understanding and wishes to proceed.  We will obtain CBC, BMP, and COVID-19 test prior to procedure.  Previous echocardiogram  showed suggestion of aortic root dilatation however the CT angiogram although not indicated of the chest done in 2020 was essentially unremarkable with regard to cardiac structures.  However in the repeat echocardiogram in a year would be appropriate and can be performed by his PCP.  Patient and his wife have reduced smoking from 2 packs per day to 1 pack per day. Encouraged complete smoking cessation. Also discussed the importance of BiPAP compliance in view of COPD and sleep apnea.    Alethia Berthold, PA-C 04/26/2020, 12:51 PM Office: [phone]

## 2020-04-23 NOTE — H&P (View-Only) (Signed)
Primary Physician/Referring:  Monico Blitz, MD  Patient ID: [name] [name], male    DOB: [birth year], 60 y.o.   MRN: [id number]  Chief Complaint  Patient presents with  . Chest Pain  . Shortness of Breath  . Hypertension  . Follow-up    6 week  . Dizziness   HPI:    [name] [name]  is a 60 y.o. Caucasian male patient with heavy tobacco use disorder (>41-pack-year history), COPD with centrilobular and paraseptal emphysema, disabled due to spinal stenosis and chronic back pain and COPD with severe chronic dyspnea, obstructive sleep apnea on BiPAP, hypertension originally referred for evaluation of chest pain.  Patient reports he has had 2 cardiac catheter in the remote past approximately 15-18 years ago.  He reports no stent implantation, but recalls being told he had small vessel disease.  Patient presents for urgent visit to discuss abnormal stress test results as well as follow-up on chest pain.  Recent nuclear stress test revealed mild reversible ischemia in the apical lateral wall and mid inferolateral wall of left ventricle.  At last visit started patient on amlodipine 5 mg daily in the evening, however since last visit patient has been seen by PCP who in view of recent abnormal stress test discontinued amlodipine and pain and started patient on metoprolol succinate 25 mg daily.  Patient's blood pressure remains elevated above goal, and he brings with him a written blood pressure log which reveals episodic significant increases in blood pressure.  He is tolerating metoprolol without issue. Patient's PCP has also started him on aspirin 81 mg daily.  Patient continues to have dyspnea on exertion and occasional episodes of brief sharp chest pain.  He has no active chest pain presently.  He also has mild bilateral lower leg edema.  Patient denies palpitations, syncope, near syncope, PND, orthopnea.  Past Medical History:  Diagnosis Date  . Full thickness tear of left subscapularis  tendon 09/05/2018  . Hypertension   . Sleep apnea 1996   does not wear CPAP   Past Surgical History:  Procedure Laterality Date  . CARDIAC CATHETERIZATION  2010  . JOINT REPLACEMENT    . left hip replacement  1996  . SHOULDER ARTHROSCOPY WITH ROTATOR CUFF REPAIR AND SUBACROMIAL DECOMPRESSION Left 09/05/2018   Procedure: LEFT SHOULDER ARTHROSCOPY WITH SUBSCAPULARIS AND SUBACROMIAL DECOMPRESSION, BICEP TENODESIS, EXTENSIVE DEBRIDEMENT;  Surgeon: Marchia Bond, MD;  Location: [facility];  Service: Orthopedics;  Laterality: Left;   Family History  Problem Relation Age of Onset  . Congestive Heart Failure Mother   . Scoliosis Mother   . High blood pressure Mother   . Lung cancer Father   . COPD Father   . Diabetes Father   . Gout Father   . Emphysema Father   . Cirrhosis Brother   . Cirrhosis Brother     Social History   Tobacco Use  . Smoking status: Current Every Day Smoker    Packs/day: 1.00    Years: 41.00    Pack years: 41.00    Types: Cigarettes  . Smokeless tobacco: Never Used  Substance Use Topics  . Alcohol use: Never   Marital Status: Married  ROS  Review of Systems  Constitutional: Negative for malaise/fatigue and weight gain.  Cardiovascular: Positive for chest pain. Negative for claudication, leg swelling, near-syncope, orthopnea, palpitations, paroxysmal nocturnal dyspnea and syncope.  Respiratory: Positive for shortness of breath and wheezing.   Hematologic/Lymphatic: Does not bruise/bleed easily.  Musculoskeletal: Positive for arthritis, back  pain and neck pain.  Gastrointestinal: Negative.  Negative for melena.  Genitourinary: Negative.   Neurological: Negative for dizziness and weakness.   Objective  Blood pressure (!) 141/96, pulse 60, temperature 98.3 F (36.8 C), resp. rate 17, height _0  (1.778 m), weight 184 lb 9.6 oz (83.7 kg), SpO2 96 %.  Vitals with BMI 04/26/2020 04/26/2020 04/05/2020  Height - _1  _2   Weight - 184 lbs 10  oz 172 lbs 6 oz  BMI - 17.00 17.49  Systolic 449 675 916  Diastolic 96 91 84  Pulse 60 61 66     Physical Exam Constitutional:      Appearance: Normal appearance.  Cardiovascular:     Rate and Rhythm: Normal rate and regular rhythm.     Pulses: Intact distal pulses.     Heart sounds: Normal heart sounds. No murmur heard. No gallop.      Comments: No leg edema, no JVD. Pulmonary:     Effort: Pulmonary effort is normal.     Breath sounds: Wheezing (bilateral scattered) and rales (bilateral scattered) present.  Abdominal:     General: Bowel sounds are normal.     Palpations: Abdomen is soft.  Musculoskeletal:     Right lower leg: Edema (1+ pitting) present.     Left lower leg: Edema (1+ pitting) present.  Skin:    General: Skin is warm and dry.  Neurological:     General: No focal deficit present.     Mental Status: He is oriented to person, place, and time.    Laboratory examination:   Recent Labs    04/02/20 0155  NA 141  K 3.6  CL 107  CO2 24  GLUCOSE 96  BUN 16  CREATININE 1.06  CALCIUM 8.7*  GFRNONAA >60   CrCl cannot be calculated (Patient's most recent lab result is older than the maximum 21 days allowed.).  CMP Latest Ref Rng & Units 04/02/2020 09/06/2018  Glucose 70 - 99 mg/dL 96 112(H)  BUN 6 - 20 mg/dL 16 17  Creatinine 0.61 - 1.24 mg/dL 1.06 0.90  Sodium 135 - 145 mmol/L 141 139  Potassium 3.5 - 5.1 mmol/L 3.6 4.1  Chloride 98 - 111 mmol/L 107 102  CO2 22 - 32 mmol/L 24 -  Calcium 8.9 - 10.3 mg/dL 8.7(L) -  Total Protein 6.5 - 8.1 g/dL 6.3(L) -  Total Bilirubin 0.3 - 1.2 mg/dL 0.5 -  Alkaline Phos 38 - 126 U/L 46 -  AST 15 - 41 U/L 15 -  ALT 0 - 44 U/L 13 -   CBC Latest Ref Rng & Units 04/02/2020 09/06/2018 09/06/2018  WBC 4.0 - 10.5 K/uL 8.1 - 15.3(H)  Hemoglobin 13.0 - 17.0 g/dL 12.8(L) 13.9 14.0  Hematocrit 39.0 - 52.0 % 39.3 41.0 41.2  Platelets 150 - 400 K/uL 224 - 266    External labs:   02/04/2020: Total cholesterol 163, triglycerides  60, HDL 42, LDL 109. TSH normal. Hb 13.3/HCT 39.5, platelets 172, normal indicis. Serum glucose 104 mg, BUN 13, creatinine 1.08, EGFR 76 mL, CMP otherwise normal. Uric acid 5.4.  Medications and allergies   Allergies  Allergen Reactions  . Hydrocodone     hallucinations     Outpatient Medications Prior to Visit  Medication Sig  . albuterol (VENTOLIN HFA) 108 (90 Base) MCG/ACT inhaler Inhale 1 puff into the lungs as needed.  Marland Kitchen aspirin 81 MG chewable tablet Chew by mouth daily.  . budesonide-formoterol (SYMBICORT) 160-4.5 MCG/ACT inhaler Inhale  1 puff into the lungs as needed.  . busPIRone (BUSPAR) 10 MG tablet Take 10 mg by mouth 3 (three) times daily as needed.  . gabapentin (NEURONTIN) 300 MG capsule Take 300 mg by mouth 3 (three) times daily.  Marland Kitchen lisinopril (ZESTRIL) 20 MG tablet Take 20 mg by mouth daily.  . meloxicam (MOBIC) 15 MG tablet Take 15 mg by mouth daily.  Marland Kitchen omeprazole (PRILOSEC) 40 MG capsule Take 40 mg by mouth daily.  Marland Kitchen tiZANidine (ZANAFLEX) 4 MG tablet Take 4 mg by mouth 3 (three) times daily.  . metoprolol succinate (TOPROL-XL) 25 MG 24 hr tablet Take 1 tablet by mouth daily.  . [DISCONTINUED] amLODipine (NORVASC) 5 MG tablet Take 1 tablet (5 mg total) by mouth daily in the afternoon.   No facility-administered medications prior to visit.    Radiology:    Chest x-ray PA and lateral view 04/02/2020: Bullous disease within the right apex is unchanged. Lungs are otherwise clear. No pneumothorax or pleural effusion. Cardiac size within normal limits. Pulmonary vascularity is normal. No acute bone abnormality.  CT angio chest 09/06/2018: 1. Cardiovascular: There is no cardiomegaly or pericardial effusion. The thoracic aorta is unremarkable.  The origins of the great vessels of the aortic arch are patent as visualized. There is no pulmonary artery embolism. 2. Lungs/Pleura: There is moderate centrilobular and paraseptal emphysema. Patchy areas of consolidative  change in the left lower lobe and lingula noted which may represent atelectasis or infiltrate. Clinical correlation is recommended. Right lung base linear atelectasis/scarring. There is no pleural effusion or pneumothorax. The central airways are patent. 3. Right adrenal adenoma. 4. Soft tissue air in the left subpectoral region and base of the left side of the neck likely related to recent surgery. No drainable fluid collection.  Cardiac Studies:   PCV MYOCARDIAL PERFUSION WITH LEXISCAN 04/14/2020 Nondiagnostic ECG stress. Moderate degree medium extent perfusion defect consistent with mild (reversible) ischemia located in the apical lateral wall and mid inferolateral wall (Left Circumflex Artery region) of left ventricle. Overall LV systolic function is normal with regional wall motion abnormalities. Stress LV EF: 44%. No previous exam available for comparison. Intermediate risk study.  Echocardiogram performed at PCPs office 03/22/2020: Normal LV size and systolic function, EF 25%.  Mild diastolic dysfunction without elevation in left atrial pressure. Left atrium is mild to moderately dilated. Right atrium is mildly dilated. Aortic root is mildly dilated at 3.95. Mild tricuspid regurgitation, PA pressure estimated at 24 mmHg, RA =8 mmHg. IVC is dilated with normal respiratory variation.  EKG:   EKG 04/05/2020: Normal sinus rhythm at rate of 62 bpm, left atrial enlargement, normal axis, incomplete right bundle branch block.  No evidence of ischemia.     Assessment     ICD-10-CM   1. Atypical chest pain  R07.89   2. Dyspnea on exertion  R06.00   3. Adenoma of right adrenal gland  D35.01 Aldosterone + renin activity w/ ratio    CT ADRENAL ABD WO  4. Primary hypertension  I10 amLODipine (NORVASC) 5 MG tablet    Aldosterone + renin activity w/ ratio    CT ADRENAL ABD WO  5. Abnormal stress test  R94.39 CBC    Basic metabolic panel    Novel Coronavirus, NAA ([facility])      Medications Discontinued During This Encounter  Medication Reason  . amLODipine (NORVASC) 5 MG tablet Error    Meds ordered this encounter  Medications  . atorvastatin (LIPITOR) 40 MG tablet  Sig: Take 1 tablet (40 mg total) by mouth daily.    Dispense:  90 tablet    Refill:  3  . amLODipine (NORVASC) 5 MG tablet    Sig: Take 1 tablet (5 mg total) by mouth every evening.    Dispense:  30 tablet    Refill:  2   Orders Placed This Encounter  Procedures  . Novel Coronavirus, NAA ([facility])    Order Specific Question:   Is this test for diagnosis or screening    Answer:   Screening    Order Specific Question:   Symptomatic for COVID-19 as defined by CDC    Answer:   No    Order Specific Question:   Hospitalized for COVID-19    Answer:   No    Order Specific Question:   Admitted to ICU for COVID-19    Answer:   No    Order Specific Question:   Previously tested for COVID-19    Answer:   Yes    Order Specific Question:   Resident in a congregate (group) care setting    Answer:   No    Order Specific Question:   Is the patient student?    Answer:   No    Order Specific Question:   Employed in healthcare setting    Answer:   No    Order Specific Question:   Has patient completed COVID vaccination(s) (2 doses of Pfizer/Moderna 1 dose of [employer])    Answer:   Yes    Order Specific Question:   Has patient completed COVID Booster / 3rd dose    Answer:   No  . CT ADRENAL ABD WO    Standing Status:   Future    Standing Expiration Date:   04/26/2021    Order Specific Question:   Preferred imaging location?    Answer:   [facility]    Order Specific Question:   Is Oral Contrast requested for this exam?    Answer:   Yes, Per Radiology protocol  . CBC  . Basic metabolic panel  . Aldosterone + renin activity w/ ratio   Recommendations:   LOUKAS ANTONSON is a 60 y.o. Caucasian male patient with heavy tobacco use disorder (>41-pack-year history), COPD with  centrilobular and paraseptal emphysema, disabled due to spinal stenosis and chronic back pain and COPD, obstructive sleep apnea on BiPAP, hypertension originally referred for evaluation of chest pain.  Patient presents for urgent visit to discuss abnormal stress test results as well as follow-up on chest pain.  Recent nuclear stress test revealed mild reversible ischemia in the apical lateral wall and mid inferolateral wall of left ventricle.  As patient's blood pressure remains elevated with metoprolol succinate, Will restart him on amlodipine 5 mg daily.  Patient will continue to monitor his blood pressure and notify and notify our office of episodes of hypotension.  Patient does continue to have paroxysmal A. fib paroxysmal increases in blood pressure which may be related to adrenal adenoma.  Will investigate further, obtain renin aldosterone ratio as well as CT adrenal abdomen.  Reviewed and discussed with patient and his wife who is present at bedside regarding results of stress testing.  In view of abnormal stress test and elevated LDL on last lipid panel, will start patient on atorvastatin 40 mg daily.  Also discussed with patient management options including proceeding with cardiac catheterization. The left heart catheterization procedure was explained to the patient in detail. The  indication, alternatives, risks and benefits were reviewed. Complications including but not limited to bleeding, infection, acute kidney injury, blood transfusion, heart rhythm disturbances, contrast (dye) reaction, damage to the arteries or nerves in the legs or hands, cerebrovascular accident, myocardial infarction, need for emergent bypass surgery, blood clots in the legs, possible need for emergent blood transfusion, and rarely death were reviewed and discussed with the patient. The patient voices understanding and wishes to proceed.  We will obtain CBC, BMP, and COVID-19 test prior to procedure.  Previous echocardiogram  showed suggestion of aortic root dilatation however the CT angiogram although not indicated of the chest done in 2020 was essentially unremarkable with regard to cardiac structures.  However in the repeat echocardiogram in a year would be appropriate and can be performed by his PCP.  Patient and his wife have reduced smoking from 2 packs per day to 1 pack per day. Encouraged complete smoking cessation. Also discussed the importance of BiPAP compliance in view of COPD and sleep apnea.    Alethia Berthold, PA-C 04/26/2020, 12:51 PM Office: [phone]

## 2020-04-26 ENCOUNTER — Other Ambulatory Visit: Payer: Self-pay

## 2020-04-26 ENCOUNTER — Ambulatory Visit: Payer: 59 | Admitting: Student

## 2020-04-26 ENCOUNTER — Encounter: Payer: Self-pay | Admitting: Student

## 2020-04-26 VITALS — BP 141/96 | HR 60 | Temp 98.3°F | Resp 17 | Ht 70.0 in | Wt 184.6 lb

## 2020-04-26 DIAGNOSIS — R0600 Dyspnea, unspecified: Secondary | ICD-10-CM

## 2020-04-26 DIAGNOSIS — R0789 Other chest pain: Secondary | ICD-10-CM

## 2020-04-26 DIAGNOSIS — R0609 Other forms of dyspnea: Secondary | ICD-10-CM

## 2020-04-26 DIAGNOSIS — D3501 Benign neoplasm of right adrenal gland: Secondary | ICD-10-CM

## 2020-04-26 DIAGNOSIS — I10 Essential (primary) hypertension: Secondary | ICD-10-CM

## 2020-04-26 DIAGNOSIS — R9439 Abnormal result of other cardiovascular function study: Secondary | ICD-10-CM

## 2020-04-26 MED ORDER — ATORVASTATIN CALCIUM 40 MG PO TABS: 40.0000 mg | ORAL_TABLET | Freq: Every day | ORAL | 3 refills | Status: AC

## 2020-04-26 MED ORDER — AMLODIPINE BESYLATE 5 MG PO TABS: 5.0000 mg | ORAL_TABLET | Freq: Every evening | ORAL | 2 refills | Status: DC

## 2020-05-05 NOTE — Telephone Encounter (Signed)
done

## 2020-05-13 ENCOUNTER — Ambulatory Visit
Admission: RE | Admit: 2020-05-13 | Discharge: 2020-05-13 | Disposition: A | Discharge status: Home / Self Care | Payer: 59 | Source: Ambulatory Visit | Attending: Student | Admitting: Student

## 2020-05-13 DIAGNOSIS — I10 Essential (primary) hypertension: Secondary | ICD-10-CM

## 2020-05-13 DIAGNOSIS — D3501 Benign neoplasm of right adrenal gland: Secondary | ICD-10-CM

## 2020-05-14 ENCOUNTER — Other Ambulatory Visit (HOSPITAL_COMMUNITY)
Admission: RE | Admit: 2020-05-14 | Discharge: 2020-05-14 | Disposition: A | Discharge status: Home / Self Care | Payer: 59 | Source: Ambulatory Visit | Attending: Cardiology | Admitting: Cardiology

## 2020-05-14 DIAGNOSIS — Z20822 Contact with and (suspected) exposure to covid-19: Secondary | ICD-10-CM | POA: Insufficient documentation

## 2020-05-14 DIAGNOSIS — Z01812 Encounter for preprocedural laboratory examination: Secondary | ICD-10-CM | POA: Diagnosis present

## 2020-05-14 NOTE — Progress Notes (Signed)
Please notify patient adrenal mass appears benign. Will discuss further at office visit next month.

## 2020-05-15 LAB — SARS CORONAVIRUS 2 (TAT 6-24 HRS): SARS Coronavirus 2: NEGATIVE

## 2020-05-18 ENCOUNTER — Ambulatory Visit (HOSPITAL_COMMUNITY)
Admission: RE | Admit: 2020-05-18 | Discharge: 2020-05-18 | Disposition: A | Discharge status: Home / Self Care | Payer: 59 | Attending: Cardiology | Admitting: Cardiology

## 2020-05-18 ENCOUNTER — Encounter (HOSPITAL_COMMUNITY)
Admission: RE | Disposition: A | Discharge status: Home / Self Care | Payer: Self-pay | Source: Home / Self Care | Attending: Cardiology

## 2020-05-18 ENCOUNTER — Other Ambulatory Visit: Payer: Self-pay

## 2020-05-18 DIAGNOSIS — F17210 Nicotine dependence, cigarettes, uncomplicated: Secondary | ICD-10-CM | POA: Diagnosis not present

## 2020-05-18 DIAGNOSIS — R0602 Shortness of breath: Secondary | ICD-10-CM | POA: Insufficient documentation

## 2020-05-18 DIAGNOSIS — R0609 Other forms of dyspnea: Secondary | ICD-10-CM | POA: Diagnosis present

## 2020-05-18 DIAGNOSIS — R079 Chest pain, unspecified: Secondary | ICD-10-CM | POA: Diagnosis present

## 2020-05-18 DIAGNOSIS — J449 Chronic obstructive pulmonary disease, unspecified: Secondary | ICD-10-CM | POA: Diagnosis not present

## 2020-05-18 DIAGNOSIS — I209 Angina pectoris, unspecified: Secondary | ICD-10-CM | POA: Diagnosis present

## 2020-05-18 DIAGNOSIS — G4733 Obstructive sleep apnea (adult) (pediatric): Secondary | ICD-10-CM | POA: Insufficient documentation

## 2020-05-18 DIAGNOSIS — I10 Essential (primary) hypertension: Secondary | ICD-10-CM | POA: Diagnosis not present

## 2020-05-18 DIAGNOSIS — R0789 Other chest pain: Secondary | ICD-10-CM | POA: Diagnosis not present

## 2020-05-18 DIAGNOSIS — Z79899 Other long term (current) drug therapy: Secondary | ICD-10-CM | POA: Insufficient documentation

## 2020-05-18 DIAGNOSIS — R0600 Dyspnea, unspecified: Secondary | ICD-10-CM | POA: Diagnosis present

## 2020-05-18 HISTORY — PX: LEFT HEART CATH AND CORONARY ANGIOGRAPHY: CATH118249

## 2020-05-18 LAB — BASIC METABOLIC PANEL
Anion gap: 7 (ref 5–15)
BUN: 13 mg/dL (ref 6–20)
CO2: 25 mmol/L (ref 22–32)
Calcium: 8.8 mg/dL — ABNORMAL LOW (ref 8.9–10.3)
Chloride: 105 mmol/L (ref 98–111)
Creatinine, Ser: 1 mg/dL (ref 0.61–1.24)
GFR, Estimated: >60 mL/min (ref >60)
Glucose, Bld: 107 mg/dL — ABNORMAL HIGH (ref 70–99)
Potassium: 4 mmol/L (ref 3.5–5.1)
Sodium: 137 mmol/L (ref 135–145)

## 2020-05-18 LAB — CBC
HCT: 41.3 % (ref 39.0–52.0)
Hemoglobin: 13.6 g/dL (ref 13.0–17.0)
MCH: 31.1 pg (ref 26.0–34.0)
MCHC: 32.9 g/dL (ref 30.0–36.0)
MCV: 94.3 fL (ref 80.0–100.0)
Platelets: 237 10*3/uL (ref 150–400)
RBC: 4.38 MIL/uL (ref 4.22–5.81)
RDW: 12 % (ref 11.5–15.5)
WBC: 9.1 10*3/uL (ref 4.0–10.5)
nRBC: 0 % (ref 0.0–0.2)

## 2020-05-18 SURGERY — LEFT HEART CATH AND CORONARY ANGIOGRAPHY
Anesthesia: LOCAL

## 2020-05-18 MED ORDER — HEPARIN SODIUM (PORCINE) 1000 UNIT/ML IJ SOLN
INTRAMUSCULAR | Status: AC
  Filled 2020-05-18: qty 1

## 2020-05-18 MED ORDER — VERAPAMIL HCL 2.5 MG/ML IV SOLN
INTRAVENOUS | Status: AC
  Filled 2020-05-18: qty 2

## 2020-05-18 MED ORDER — SODIUM CHLORIDE 0.9 % IV SOLN: 250.0000 mL | INTRAVENOUS | Status: DC | PRN

## 2020-05-18 MED ORDER — LIDOCAINE HCL (PF) 1 % IJ SOLN
INTRAMUSCULAR | Status: AC
  Filled 2020-05-18: qty 30

## 2020-05-18 MED ORDER — SODIUM CHLORIDE 0.9% FLUSH: 3.0000 mL | INTRAVENOUS | Status: DC | PRN

## 2020-05-18 MED ORDER — SODIUM CHLORIDE 0.9 % WEIGHT BASED INFUSION: 1.0000 mL/kg/h | INTRAVENOUS | Status: DC

## 2020-05-18 MED ORDER — SODIUM CHLORIDE 0.9% FLUSH: 3.0000 mL | Freq: Two times a day (BID) | INTRAVENOUS | Status: DC

## 2020-05-18 MED ORDER — ASPIRIN 81 MG PO CHEW: 81.0000 mg | CHEWABLE_TABLET | ORAL | Status: DC

## 2020-05-18 MED ORDER — VERAPAMIL HCL 2.5 MG/ML IV SOLN
INTRAVENOUS | Status: DC | PRN
  Administered 2020-05-18: 5 mL via INTRA_ARTERIAL

## 2020-05-18 MED ORDER — LIDOCAINE HCL (PF) 1 % IJ SOLN
INTRAMUSCULAR | Status: DC | PRN
  Administered 2020-05-18: 2 mL

## 2020-05-18 MED ORDER — HEPARIN (PORCINE) IN NACL 1000-0.9 UT/500ML-% IV SOLN
INTRAVENOUS | Status: DC | PRN
  Administered 2020-05-18 (×3): 500 mL

## 2020-05-18 MED ORDER — HEPARIN (PORCINE) IN NACL 1000-0.9 UT/500ML-% IV SOLN
INTRAVENOUS | Status: AC
  Filled 2020-05-18: qty 1000

## 2020-05-18 MED ORDER — SODIUM CHLORIDE 0.9 % WEIGHT BASED INFUSION
3.0000 mL/kg/h | INTRAVENOUS | Status: AC
  Administered 2020-05-18: 3 mL/kg/h via INTRAVENOUS

## 2020-05-18 MED ORDER — IOHEXOL 350 MG/ML SOLN
INTRAVENOUS | Status: DC | PRN
  Administered 2020-05-18: 70 mL

## 2020-05-18 MED ORDER — HEPARIN SODIUM (PORCINE) 1000 UNIT/ML IJ SOLN
INTRAMUSCULAR | Status: DC | PRN
  Administered 2020-05-18: 5000 [IU] via INTRAVENOUS

## 2020-05-18 MED ORDER — MIDAZOLAM HCL 2 MG/2ML IJ SOLN
INTRAMUSCULAR | Status: AC
  Filled 2020-05-18: qty 2

## 2020-05-18 MED ORDER — SODIUM CHLORIDE 0.9 % WEIGHT BASED INFUSION: 3.0000 mL/kg/h | INTRAVENOUS | Status: DC

## 2020-05-18 SURGICAL SUPPLY — 10 items
CATH INFINITI JR4 5F (CATHETERS) ×1 IMPLANT
CATH OPTITORQUE TIG 4.0 5F (CATHETERS) ×1 IMPLANT
DEVICE RAD COMP TR BAND LRG (VASCULAR PRODUCTS) ×1 IMPLANT
GLIDESHEATH SLEND A-KIT 6F 22G (SHEATH) ×1 IMPLANT
GUIDEWIRE INQWIRE 1.5J.035X260 (WIRE) IMPLANT
INQWIRE 1.5J .035X260CM (WIRE) ×2
KIT HEART LEFT (KITS) ×2 IMPLANT
PACK CARDIAC CATHETERIZATION (CUSTOM PROCEDURE TRAY) ×2 IMPLANT
TRANSDUCER W/STOPCOCK (MISCELLANEOUS) ×2 IMPLANT
TUBING CIL FLEX 10 FLL-RA (TUBING) ×2 IMPLANT

## 2020-05-18 NOTE — Discharge Instructions (Signed)
Drink plenty of fluids for 48 hours and keep wrist elevated at heart level for 24 hours  Radial Site Care   This sheet gives you information about how to care for yourself after your procedure. Your health care provider may also give you more specific instructions. If you have problems or questions, contact your health care provider. What can I expect after the procedure? After the procedure, it is common to have:  Bruising and tenderness at the catheter insertion area. Follow these instructions at home: Medicines  Take over-the-counter and prescription medicines only as told by your health care provider. Insertion site care 1. Follow instructions from your health care provider about how to take care of your insertion site. Make sure you: ? Wash your hands with soap and water before you change your bandage (dressing). If soap and water are not available, use hand sanitizer. ? Remove your dressing as told by your health care provider. In 24 hours 2. Check your insertion site every day for signs of infection. Check for: ? Redness, swelling, or pain. ? Fluid or blood. ? Pus or a bad smell. ? Warmth. 3. Do not take baths, swim, or use a hot tub until your health care provider approves. 4. You may shower 24-48 hours after the procedure, or as directed by your health care provider. ? Remove the dressing and gently wash the site with plain soap and water. ? Pat the area dry with a clean towel. ? Do not rub the site. That could cause bleeding. 5. Do not apply powder or lotion to the site. Activity   1. For 24 hours after the procedure, or as directed by your health care provider: ? Do not flex or bend the affected arm. ? Do not push or pull heavy objects with the affected arm. ? Do not drive yourself home from the hospital or clinic. You may drive 24 hours after the procedure unless your health care provider tells you not to. ? Do not operate machinery or power tools. 2. Do not lift  anything that is heavier than 10 lb (4.5 kg), or the limit that you are told, until your health care provider says that it is safe.  For 4 days 3. Ask your health care provider when it is okay to: ? Return to work or school. ? Resume usual physical activities or sports. ? Resume sexual activity. General instructions  If the catheter site starts to bleed, raise your arm and put firm pressure on the site. If the bleeding does not stop, get help right away. This is a medical emergency.  If you went home on the same day as your procedure, a responsible adult should be with you for the first 24 hours after you arrive home.  Keep all follow-up visits as told by your health care provider. This is important. Contact a health care provider if:  You have a fever.  You have redness, swelling, or yellow drainage around your insertion site. Get help right away if:  You have unusual pain at the radial site.  The catheter insertion area swells very fast.  The insertion area is bleeding, and the bleeding does not stop when you hold steady pressure on the area.  Your arm or hand becomes pale, cool, tingly, or numb. These symptoms may represent a serious problem that is an emergency. Do not wait to see if the symptoms will go away. Get medical help right away. Call your local emergency services (911 in the [place] Do   not drive yourself to the hospital. Summary  After the procedure, it is common to have bruising and tenderness at the site.  Follow instructions from your health care provider about how to take care of your radial site wound. Check the wound every day for signs of infection.  Do not lift anything that is heavier than 10 lb (4.5 kg), or the limit that you are told, until your health care provider says that it is safe. This information is not intended to replace advice given to you by your health care provider. Make sure you discuss any questions you have with your health care  provider. Document Revised: 02/14/2017 Document Reviewed: 02/14/2017 Elsevier Patient Education  2020 [employer]  

## 2020-05-18 NOTE — Research (Signed)
IDENTIFY Informed Consent   Subject Name: [name] [name]  Subject met inclusion and exclusion criteria.  The informed consent form, study requirements and expectations were reviewed with the subject and questions and concerns were addressed prior to the signing of the consent form.  The subject verbalized understanding of the trail requirements.  The subject agreed to participate in the IDENTIFY  trial and signed the informed consent.  The informed consent was obtained prior to performance of any protocol-specific procedures for the subject.  A copy of the signed informed consent was given to the subject and a copy was placed in the subject's medical record.  [name]. 05/18/2020, 10:10 AM

## 2020-05-18 NOTE — Interval H&P Note (Signed)
History and Physical Interval Note:  05/18/2020 11:10 AM  [name] [name]  has presented today for surgery, with the diagnosis of chest pain - sob - doe.  The various methods of treatment have been discussed with the patient and family. After consideration of risks, benefits and other options for treatment, the patient has consented to  Procedure(s): LEFT HEART CATH AND CORONARY ANGIOGRAPHY (N/A) and possible angiplasty as a surgical intervention.  The patient's history has been reviewed, patient examined, no change in status, stable for surgery.  I have reviewed the patient's chart and labs.  Questions were answered to the patient's satisfaction.   Cath Lab Visit (complete for each Cath Lab visit)  Clinical Evaluation Leading to the Procedure:   ACS: No.  Non-ACS:    Anginal Classification: CCS III  Anti-ischemic medical therapy: Maximal Therapy (2 or more classes of medications)  Non-Invasive Test Results: Intermediate-risk stress test findings: cardiac mortality 1-3%/year  Prior CABG: No previous CABG    Adrian Prows

## 2020-05-19 ENCOUNTER — Encounter (HOSPITAL_COMMUNITY): Payer: Self-pay | Admitting: Cardiology

## 2020-05-19 NOTE — Progress Notes (Signed)
Called and spoke with patient regarding his CT result.

## 2020-05-25 LAB — BASIC METABOLIC PANEL
BUN/Creatinine Ratio: 11 (ref 9–20)
BUN: 15 mg/dL (ref 6–24)
CO2: 21 mmol/L (ref 20–29)
Calcium: 9.3 mg/dL (ref 8.7–10.2)
Chloride: 103 mmol/L (ref 96–106)
Creatinine, Ser: 1.31 mg/dL — ABNORMAL HIGH (ref 0.76–1.27)
Glucose: 94 mg/dL (ref 65–99)
Potassium: 4.3 mmol/L (ref 3.5–5.2)
Sodium: 141 mmol/L (ref 134–144)
eGFR: 63 mL/min/{1.73_m2} (ref >59)

## 2020-05-25 LAB — ALDOSTERONE + RENIN ACTIVITY W/ RATIO
ALDOS/RENIN RATIO: 9.7 (ref 0.0–30.0)
ALDOSTERONE: 4.7 ng/dL (ref 0.0–30.0)
Renin: 0.484 ng/mL/hr (ref 0.167–5.380)

## 2020-05-25 LAB — CBC
Hematocrit: 39.6 % (ref 37.5–51.0)
Hemoglobin: 13.8 g/dL (ref 13.0–17.7)
MCH: 31.4 pg (ref 26.6–33.0)
MCHC: 34.8 g/dL (ref 31.5–35.7)
MCV: 90 fL (ref 79–97)
Platelets: 218 10*3/uL (ref 150–450)
RBC: 4.4 x10E6/uL (ref 4.14–5.80)
RDW: 11.4 % — ABNORMAL LOW (ref 11.6–15.4)
WBC: 8.3 10*3/uL (ref 3.4–10.8)

## 2020-05-28 ENCOUNTER — Ambulatory Visit: Payer: 59 | Admitting: Cardiology

## 2020-06-02 NOTE — Progress Notes (Signed)
Primary Physician/Referring:  Monico Blitz, MD  Patient ID: [name] [name], male    DOB: 09-08-1960, 60 y.o.   MRN: [id number]  Chief Complaint  Patient presents with  . Follow-up    4 weeks for post cath   HPI:    CUTLER SUNDAY  is a 60 y.o. Caucasian male patient with heavy tobacco use disorder (>41-pack-year history), COPD with centrilobular and paraseptal emphysema, disabled due to spinal stenosis and chronic back pain and COPD with severe chronic dyspnea, obstructive sleep apnea on BiPAP, hypertension originally referred for evaluation of chest pain.  Patient reports he has had 2 cardiac catheterization in the remote past approximately 15-18 years ago.  He reports no stent implantation, but recalls being told he had small vessel disease.  Patient had abnormal stress test along with dyspnea on exertion and occasional episodes of chest pain, therefore he underwent cardiac catheterization.   Underwent left heart catheterization coronary angiography 05/18/2020 which revealed no significant coronary artery disease. Patient now presents for follow up.  Patient reports overall he is feeling relatively well.  Although he continues to have dyspnea this is stable.  He also continues to have occasional brief episodes of sharp pain in his left chest and shoulder, highly suggestive of musculoskeletal etiology.  Bilateral lower leg edema has improved since last visit.  Also patient is congratulated as she has reduced smoking from 2 packs of cigarettes per day to 8 cigarettes/day.  He denies palpitations, syncope, near syncope, dizziness, PND, orthopnea.  Past Medical History:  Diagnosis Date  . Full thickness tear of left subscapularis tendon 09/05/2018  . Hypertension   . Sleep apnea 1996   does not wear CPAP   Past Surgical History:  Procedure Laterality Date  . CARDIAC CATHETERIZATION  2010  . JOINT REPLACEMENT    . LEFT HEART CATH AND CORONARY ANGIOGRAPHY N/A 05/18/2020   Procedure:  LEFT HEART CATH AND CORONARY ANGIOGRAPHY;  Surgeon: Adrian Prows, MD;  Location: [facility];  Service: Cardiovascular;  Laterality: N/A;  . left hip replacement  1996  . SHOULDER ARTHROSCOPY WITH ROTATOR CUFF REPAIR AND SUBACROMIAL DECOMPRESSION Left 09/05/2018   Procedure: LEFT SHOULDER ARTHROSCOPY WITH SUBSCAPULARIS AND SUBACROMIAL DECOMPRESSION, BICEP TENODESIS, EXTENSIVE DEBRIDEMENT;  Surgeon: Marchia Bond, MD;  Location: [facility];  Service: Orthopedics;  Laterality: Left;   Family History  Problem Relation Age of Onset  . Congestive Heart Failure Mother   . Scoliosis Mother   . High blood pressure Mother   . Lung cancer Father   . COPD Father   . Diabetes Father   . Gout Father   . Emphysema Father   . Cirrhosis Brother   . Cirrhosis Brother     Social History   Tobacco Use  . Smoking status: Current Every Day Smoker    Packs/day: 0.25    Years: 41.00    Pack years: 10.25    Types: Cigarettes  . Smokeless tobacco: Never Used  . Tobacco comment: patient stated he has cut back to 8-9 cigarettes a day  Substance Use Topics  . Alcohol use: Never   Marital Status: Married  ROS  Review of Systems  Constitutional: Negative for malaise/fatigue and weight gain.  Cardiovascular: Positive for chest pain. Negative for claudication, leg swelling, near-syncope, orthopnea, palpitations, paroxysmal nocturnal dyspnea and syncope.  Respiratory: Positive for shortness of breath and wheezing.   Hematologic/Lymphatic: Does not bruise/bleed easily.  Musculoskeletal: Positive for arthritis, back pain and neck pain.  Gastrointestinal:  Negative.  Negative for melena.  Genitourinary: Negative.   Neurological: Negative for dizziness and weakness.   Objective  Blood pressure 126/82, pulse 79, temperature 98.9 F (37.2 C), temperature source Temporal, resp. rate 16, height '5\' 9"'  (1.753 m), weight 191 lb 3.2 oz (86.7 kg), SpO2 98 %.  Vitals with BMI 06/04/2020 05/18/2020  05/18/2020  Height '5\' 9"'  - -  Weight 191 lbs 3 oz - -  BMI 39.03 - -  Systolic 009 233 007  Diastolic 82 87 90  Pulse 79 55 50     Physical Exam Vitals reviewed.  Constitutional:      Appearance: Normal appearance.  Cardiovascular:     Rate and Rhythm: Normal rate and regular rhythm.     Pulses: Intact distal pulses.     Heart sounds: Normal heart sounds. No murmur heard. No gallop.      Comments: No leg edema, no JVD. Pulmonary:     Effort: Pulmonary effort is normal.     Breath sounds: Wheezing (bilateral scattered) present. No rales (bilateral scattered).  Musculoskeletal:     Right lower leg: Edema (trace) present.     Left lower leg: Edema (trace) present.  Skin:    General: Skin is warm and dry.  Neurological:     General: No focal deficit present.     Mental Status: He is alert and oriented to person, place, and time.    Laboratory examination:   Recent Labs    04/02/20 0155 05/14/20 1406 05/18/20 0938  NA 141 141 137  K 3.6 4.3 4.0  CL 107 103 105  CO2 '24 21 25  ' GLUCOSE 96 94 107*  BUN '16 15 13  ' CREATININE 1.06 1.31* 1.00  CALCIUM 8.7* 9.3 8.8*  GFRNONAA >60  --  >60   estimated creatinine clearance is 88.9 mL/min (by C-G formula based on SCr of 1 mg/dL).  CMP Latest Ref Rng & Units 05/18/2020 05/14/2020 04/02/2020  Glucose 70 - 99 mg/dL 107(H) 94 96  BUN 6 - 20 mg/dL '13 15 16  ' Creatinine 0.61 - 1.24 mg/dL 1.00 1.31(H) 1.06  Sodium 135 - 145 mmol/L 137 141 141  Potassium 3.5 - 5.1 mmol/L 4.0 4.3 3.6  Chloride 98 - 111 mmol/L 105 103 107  CO2 22 - 32 mmol/L '25 21 24  ' Calcium 8.9 - 10.3 mg/dL 8.8(L) 9.3 8.7(L)  Total Protein 6.5 - 8.1 g/dL - - 6.3(L)  Total Bilirubin 0.3 - 1.2 mg/dL - - 0.5  Alkaline Phos 38 - 126 U/L - - 46  AST 15 - 41 U/L - - 15  ALT 0 - 44 U/L - - 13   CBC Latest Ref Rng & Units 05/18/2020 05/14/2020 04/02/2020  WBC 4.0 - 10.5 K/uL 9.1 8.3 8.1  Hemoglobin 13.0 - 17.0 g/dL 13.6 13.8 12.8(L)  Hematocrit 39.0 - 52.0 % 41.3 39.6 39.3   Platelets 150 - 400 K/uL 237 218 224    External labs:   02/04/2020: Total cholesterol 163, triglycerides 60, HDL 42, LDL 109. TSH normal. Hb 13.3/HCT 39.5, platelets 172, normal indicis. Serum glucose 104 mg, BUN 13, creatinine 1.08, EGFR 76 mL, CMP otherwise normal. Uric acid 5.4.  Medications and allergies   Allergies  Allergen Reactions  . Hydrocodone     hallucinations     Outpatient Medications Prior to Visit  Medication Sig  . albuterol (VENTOLIN HFA) 108 (90 Base) MCG/ACT inhaler Inhale 2 puffs into the lungs every 4 (four) hours as needed for shortness of  breath (COPD).  Marland Kitchen amLODipine (NORVASC) 5 MG tablet Take 1 tablet (5 mg total) by mouth every evening.  . Aromatic Inhalants (VICKS VAPOR INHALER IN) Place 1 Inhaler into both nostrils daily as needed (congestion). Nose inhaler  . atorvastatin (LIPITOR) 40 MG tablet Take 1 tablet (40 mg total) by mouth daily.  . budesonide-formoterol (SYMBICORT) 160-4.5 MCG/ACT inhaler Inhale 2 puffs into the lungs 2 (two) times daily.  . busPIRone (BUSPAR) 10 MG tablet Take 10 mg by mouth 3 (three) times daily.  Marland Kitchen gabapentin (NEURONTIN) 300 MG capsule Take 300 mg by mouth as directed. AM : 1 tablet , NOON : 1 tablet, PM 2 tablets  . lisinopril (ZESTRIL) 20 MG tablet Take 20 mg by mouth daily.  . metoprolol succinate (TOPROL-XL) 25 MG 24 hr tablet Take 25 mg by mouth daily.  . Multiple Vitamin (MULTIVITAMIN) capsule Take 1 capsule by mouth daily. Centrum silver  . omeprazole (PRILOSEC) 40 MG capsule Take 40 mg by mouth daily.  . sodium chloride (OCEAN) 0.65 % SOLN nasal spray Place 1 spray into both nostrils as needed for congestion.  Marland Kitchen tiZANidine (ZANAFLEX) 4 MG tablet Take 4 mg by mouth 3 (three) times daily.   No facility-administered medications prior to visit.    Radiology:   CT adrenal abdomen without contrast 05/13/2020: 1. Well-circumscribed low-attenuation area arising from the RIGHT adrenal gland measuring 1.6 x 1.3 cm  with a Hounsfield unit measurement less than 0 at -10 at-10. Compatible with benign adrenal lesion likely small adenoma. Adrenal cyst is also considered based on density near 0 Hounsfield units. 2. Bandlike airspace disease at the LEFT lung base likely scarring based on the appearance of the prior chest CT. 3. Aortic atherosclerosis.  Chest x-ray PA and lateral view 04/02/2020: Bullous disease within the right apex is unchanged. Lungs are otherwise clear. No pneumothorax or pleural effusion. Cardiac size within normal limits. Pulmonary vascularity is normal. No acute bone abnormality.  CT angio chest 09/06/2018: 1. Cardiovascular: There is no cardiomegaly or pericardial effusion. The thoracic aorta is unremarkable.  The origins of the great vessels of the aortic arch are patent as visualized. There is no pulmonary artery embolism. 2. Lungs/Pleura: There is moderate centrilobular and paraseptal emphysema. Patchy areas of consolidative change in the left lower lobe and lingula noted which may represent atelectasis or infiltrate. Clinical correlation is recommended. Right lung base linear atelectasis/scarring. There is no pleural effusion or pneumothorax. The central airways are patent. 3. Right adrenal adenoma. 4. Soft tissue air in the left subpectoral region and base of the left side of the neck likely related to recent surgery. No drainable fluid collection.  Cardiac Studies:   Coronary angiography 05/18/2020: Normal LV systolic function, normal LVEDP. LM: Large-caliber vessel, smooth and normal. LAD: Very large caliber vessel giving origin to a very large D1 and a large D2.  Minimal disease is evident. CX: Moderate to large caliber vessel.  Normal. RCA: Large-caliber vessel, smooth and normal.  Recommendation: Patient does not have significant coronary disease.  His chest pain could be noncardiac.  Dyspnea is probably related to underlying COPD  PCV MYOCARDIAL PERFUSION WITH  LEXISCAN 04/14/2020 Nondiagnostic ECG stress. Moderate degree medium extent perfusion defect consistent with mild (reversible) ischemia located in the apical lateral wall and mid inferolateral wall (Left Circumflex Artery region) of left ventricle. Overall LV systolic function is normal with regional wall motion abnormalities. Stress LV EF: 44%. No previous exam available for comparison. Intermediate risk study.  Echocardiogram performed at  PCPs office 03/22/2020: Normal LV size and systolic function, EF 33%.  Mild diastolic dysfunction without elevation in left atrial pressure. Left atrium is mild to moderately dilated. Right atrium is mildly dilated. Aortic root is mildly dilated at 3.95. Mild tricuspid regurgitation, PA pressure estimated at 24 mmHg, RA =8 mmHg. IVC is dilated with normal respiratory variation.  EKG:   EKG 04/05/2020: Normal sinus rhythm at rate of 62 bpm, left atrial enlargement, normal axis, incomplete right bundle branch block.  No evidence of ischemia.     Assessment     ICD-10-CM   1. Dyspnea on exertion  R06.00   2. Abnormal stress test  R94.39   3. Tobacco use disorder  F17.200   4. Aortic root dilation (HCC)  I77.810 PCV ECHOCARDIOGRAM COMPLETE     There are no discontinued medications.  No orders of the defined types were placed in this encounter.  Orders Placed This Encounter  Procedures  . PCV ECHOCARDIOGRAM COMPLETE    Standing Status:   Future    Standing Expiration Date:   06/04/2021   Recommendations:   [name] [name] is a 60 y.o. Caucasian male patient with heavy tobacco use disorder (>41-pack-year history), COPD with centrilobular and paraseptal emphysema, disabled due to spinal stenosis and chronic back pain and COPD with severe chronic dyspnea, obstructive sleep apnea on BiPAP, hypertension originally referred for evaluation of chest pain.  Patient reports he has had 2 cardiac catheterization in the remote past approximately 15-18 years  ago.  He reports no stent implantation, but recalls being told he had small vessel disease.  Patient had abnormal stress test along with dyspnea on exertion and occasional episodes of chest pain, therefore he underwent cardiac catheterization.   Underwent left heart catheterization coronary angiography 05/18/2020 which revealed no significant coronary artery disease. Patient now presents for follow up.  Reviewed and discussed with patient regarding results of coronary angiography as well as CT to evaluate adrenal adenoma.  CT scan revealed benign adenoma, likely not contributing to episodes of elevated blood pressure.  Coronary angiography revealed no significant coronary artery disease.  Therefore no identifiable underlying cardiovascular etiology of patient's chest discomfort.  Suspect patient's chest and shoulder pain to be musculoskeletal in etiology.  Also suspect patient's dyspnea to be related to underlying COPD.  Patient is congratulated regarding efforts for smoking cessation and encouraged to continue to reduce smoking and quit entirely.  Patient's blood pressure is presently well controlled.  Will not make changes to his medications at this time.  We will plan to repeat echocardiogram to evaluate aortic root dilation in 1 year.  Patient is otherwise stable from a cardiovascular standpoint.  Previous echocardiogram showed suggestion of aortic root dilatation however the CT angiogram although not indicated of the chest done in 2020 was essentially unremarkable with regard to cardiac structures.  However in the repeat echocardiogram in a year would be appropriate, if appropriate patient's PCP may continue to follow aortic root dilation.   Follow-up in 1 year, sooner if needed, for hypertension and aortic root dilation.   Alethia Berthold, PA-C 06/04/2020, 2:01 PM Office: [phone]

## 2020-06-04 ENCOUNTER — Ambulatory Visit: Payer: 59 | Admitting: Student

## 2020-06-04 ENCOUNTER — Encounter: Payer: Self-pay | Admitting: Student

## 2020-06-04 ENCOUNTER — Other Ambulatory Visit: Payer: Self-pay

## 2020-06-04 VITALS — BP 126/82 | HR 79 | Temp 98.9°F | Resp 16 | Ht 69.0 in | Wt 191.2 lb

## 2020-06-04 DIAGNOSIS — R0600 Dyspnea, unspecified: Secondary | ICD-10-CM

## 2020-06-04 DIAGNOSIS — R9439 Abnormal result of other cardiovascular function study: Secondary | ICD-10-CM

## 2020-06-04 DIAGNOSIS — I77810 Thoracic aortic ectasia: Secondary | ICD-10-CM

## 2020-06-04 DIAGNOSIS — R0609 Other forms of dyspnea: Secondary | ICD-10-CM

## 2020-06-04 DIAGNOSIS — F17200 Nicotine dependence, unspecified, uncomplicated: Secondary | ICD-10-CM

## 2020-06-29 ENCOUNTER — Other Ambulatory Visit: Payer: Self-pay | Admitting: Cardiology

## 2020-06-29 DIAGNOSIS — I10 Essential (primary) hypertension: Secondary | ICD-10-CM

## 2020-09-28 ENCOUNTER — Other Ambulatory Visit: Payer: Self-pay | Admitting: Student

## 2020-09-28 DIAGNOSIS — I10 Essential (primary) hypertension: Secondary | ICD-10-CM

## 2021-01-07 DIAGNOSIS — Z6831 Body mass index (BMI) 31.0-31.9, adult: Secondary | ICD-10-CM | POA: Diagnosis not present

## 2021-01-07 DIAGNOSIS — I10 Essential (primary) hypertension: Secondary | ICD-10-CM | POA: Diagnosis not present

## 2021-01-07 DIAGNOSIS — I509 Heart failure, unspecified: Secondary | ICD-10-CM | POA: Diagnosis not present

## 2021-01-07 DIAGNOSIS — Z299 Encounter for prophylactic measures, unspecified: Secondary | ICD-10-CM | POA: Diagnosis not present

## 2021-01-07 DIAGNOSIS — J449 Chronic obstructive pulmonary disease, unspecified: Secondary | ICD-10-CM | POA: Diagnosis not present

## 2021-02-04 DIAGNOSIS — Z125 Encounter for screening for malignant neoplasm of prostate: Secondary | ICD-10-CM | POA: Diagnosis not present

## 2021-02-04 DIAGNOSIS — Z7189 Other specified counseling: Secondary | ICD-10-CM | POA: Diagnosis not present

## 2021-02-04 DIAGNOSIS — I10 Essential (primary) hypertension: Secondary | ICD-10-CM | POA: Diagnosis not present

## 2021-02-04 DIAGNOSIS — E7800 Pure hypercholesterolemia, unspecified: Secondary | ICD-10-CM | POA: Diagnosis not present

## 2021-02-04 DIAGNOSIS — R0989 Other specified symptoms and signs involving the circulatory and respiratory systems: Secondary | ICD-10-CM | POA: Diagnosis not present

## 2021-02-04 DIAGNOSIS — Z79899 Other long term (current) drug therapy: Secondary | ICD-10-CM | POA: Diagnosis not present

## 2021-02-04 DIAGNOSIS — R42 Dizziness and giddiness: Secondary | ICD-10-CM | POA: Diagnosis not present

## 2021-02-04 DIAGNOSIS — R69 Illness, unspecified: Secondary | ICD-10-CM | POA: Diagnosis not present

## 2021-02-04 DIAGNOSIS — Z1339 Encounter for screening examination for other mental health and behavioral disorders: Secondary | ICD-10-CM | POA: Diagnosis not present

## 2021-02-04 DIAGNOSIS — Z299 Encounter for prophylactic measures, unspecified: Secondary | ICD-10-CM | POA: Diagnosis not present

## 2021-02-04 DIAGNOSIS — Z0000 Encounter for general adult medical examination without abnormal findings: Secondary | ICD-10-CM | POA: Diagnosis not present

## 2021-02-04 DIAGNOSIS — Z1331 Encounter for screening for depression: Secondary | ICD-10-CM | POA: Diagnosis not present

## 2021-02-04 DIAGNOSIS — F17210 Nicotine dependence, cigarettes, uncomplicated: Secondary | ICD-10-CM | POA: Diagnosis not present

## 2021-02-04 DIAGNOSIS — Z6831 Body mass index (BMI) 31.0-31.9, adult: Secondary | ICD-10-CM | POA: Diagnosis not present

## 2021-02-04 DIAGNOSIS — R5383 Other fatigue: Secondary | ICD-10-CM | POA: Diagnosis not present

## 2021-02-14 DIAGNOSIS — R42 Dizziness and giddiness: Secondary | ICD-10-CM | POA: Diagnosis not present

## 2021-03-24 ENCOUNTER — Ambulatory Visit: Payer: Medicare HMO

## 2021-03-24 ENCOUNTER — Other Ambulatory Visit: Payer: Self-pay

## 2021-03-24 DIAGNOSIS — I77810 Thoracic aortic ectasia: Secondary | ICD-10-CM

## 2021-03-24 DIAGNOSIS — I10 Essential (primary) hypertension: Secondary | ICD-10-CM | POA: Diagnosis not present

## 2021-03-29 NOTE — Progress Notes (Signed)
Called patient, NA, VM box full

## 2021-03-31 NOTE — Progress Notes (Signed)
Spouse called back and made aware of results and pending appt//ah

## 2021-04-14 DIAGNOSIS — Z6830 Body mass index (BMI) 30.0-30.9, adult: Secondary | ICD-10-CM | POA: Diagnosis not present

## 2021-04-14 DIAGNOSIS — Z299 Encounter for prophylactic measures, unspecified: Secondary | ICD-10-CM | POA: Diagnosis not present

## 2021-04-14 DIAGNOSIS — I10 Essential (primary) hypertension: Secondary | ICD-10-CM | POA: Diagnosis not present

## 2021-04-14 DIAGNOSIS — Z0000 Encounter for general adult medical examination without abnormal findings: Secondary | ICD-10-CM | POA: Diagnosis not present

## 2021-04-14 DIAGNOSIS — M542 Cervicalgia: Secondary | ICD-10-CM | POA: Diagnosis not present

## 2021-04-14 DIAGNOSIS — I509 Heart failure, unspecified: Secondary | ICD-10-CM | POA: Diagnosis not present

## 2021-05-03 DIAGNOSIS — M791 Myalgia, unspecified site: Secondary | ICD-10-CM | POA: Diagnosis not present

## 2021-05-03 DIAGNOSIS — R51 Headache with orthostatic component, not elsewhere classified: Secondary | ICD-10-CM | POA: Diagnosis not present

## 2021-05-03 DIAGNOSIS — M542 Cervicalgia: Secondary | ICD-10-CM | POA: Diagnosis not present

## 2021-05-24 DIAGNOSIS — R69 Illness, unspecified: Secondary | ICD-10-CM | POA: Diagnosis not present

## 2021-05-24 DIAGNOSIS — J449 Chronic obstructive pulmonary disease, unspecified: Secondary | ICD-10-CM | POA: Diagnosis not present

## 2021-05-24 DIAGNOSIS — Z6830 Body mass index (BMI) 30.0-30.9, adult: Secondary | ICD-10-CM | POA: Diagnosis not present

## 2021-05-24 DIAGNOSIS — I10 Essential (primary) hypertension: Secondary | ICD-10-CM | POA: Diagnosis not present

## 2021-05-24 DIAGNOSIS — F17210 Nicotine dependence, cigarettes, uncomplicated: Secondary | ICD-10-CM | POA: Diagnosis not present

## 2021-05-24 DIAGNOSIS — Z299 Encounter for prophylactic measures, unspecified: Secondary | ICD-10-CM | POA: Diagnosis not present

## 2021-06-06 ENCOUNTER — Encounter: Payer: Self-pay | Admitting: Student

## 2021-06-06 ENCOUNTER — Ambulatory Visit: Payer: Medicare HMO | Admitting: Student

## 2021-06-06 VITALS — BP 153/92 | HR 61 | Temp 98.7°F | Resp 17 | Ht 69.0 in | Wt 202.4 lb

## 2021-06-06 DIAGNOSIS — F17200 Nicotine dependence, unspecified, uncomplicated: Secondary | ICD-10-CM

## 2021-06-06 DIAGNOSIS — I77810 Thoracic aortic ectasia: Secondary | ICD-10-CM

## 2021-06-06 DIAGNOSIS — I10 Essential (primary) hypertension: Secondary | ICD-10-CM

## 2021-06-06 DIAGNOSIS — R69 Illness, unspecified: Secondary | ICD-10-CM | POA: Diagnosis not present

## 2021-06-06 MED ORDER — LISINOPRIL 40 MG PO TABS: 40.0000 mg | ORAL_TABLET | Freq: Every day | ORAL | 3 refills | Status: DC

## 2021-06-06 NOTE — Progress Notes (Signed)
? ?Primary Physician/Referring:  Monico Blitz, MD ? ?Patient ID: [name] [name], male    DOB: 1960-05-20, 61 y.o.   MRN: [id number] ? ?Chief Complaint  ?Patient presents with  ? Follow-up  ?  1 year  ? Hypertension  ? aortic root dilation  ? ?HPI:   ? ?[name] [name]  is a 61 y.o. Caucasian male patient with heavy tobacco use disorder (>41-pack-year history), COPD with centrilobular and paraseptal emphysema, disabled due to spinal stenosis and chronic back pain and COPD with severe chronic dyspnea, obstructive sleep apnea on BiPAP, hypertension originally referred for evaluation of chest pain. ? ?Patient reports he has had 2 cardiac catheterization in the remote past approximately 15-18 years ago.  He reports no stent implantation, but recalls being told he had small vessel disease.  Patient had abnormal stress test along with dyspnea on exertion and occasional episodes of chest pain, therefore he underwent left heart catheterization coronary angiography 05/18/2020 which revealed no significant coronary artery disease.  ? ?Patient now presents for annual follow-up of aortic root dilation.  Repeat echocardiogram revealed aortic root is stable at 38 mm.  Overall cardiac evaluation has been reassuring without evidence of significant coronary artery disease and rather suspect patient's dyspnea to related to underlying COPD.   ? ?He denies palpitations, syncope, near syncope, dizziness, PND, orthopnea.  He is currently smoking half pack per day. ? ?Past Medical History:  ?Diagnosis Date  ? Full thickness tear of left subscapularis tendon 09/05/2018  ? Hypertension   ? Sleep apnea 1996  ? does not wear CPAP  ? ?Past Surgical History:  ?Procedure Laterality Date  ? CARDIAC CATHETERIZATION  2010  ? JOINT REPLACEMENT    ? LEFT HEART CATH AND CORONARY ANGIOGRAPHY N/A 05/18/2020  ? Procedure: LEFT HEART CATH AND CORONARY ANGIOGRAPHY;  Surgeon: Adrian Prows, MD;  Location: [facility];  Service: Cardiovascular;   Laterality: N/A;  ? left hip replacement  1996  ? SHOULDER ARTHROSCOPY WITH ROTATOR CUFF REPAIR AND SUBACROMIAL DECOMPRESSION Left 09/05/2018  ? Procedure: LEFT SHOULDER ARTHROSCOPY WITH SUBSCAPULARIS AND SUBACROMIAL DECOMPRESSION, BICEP TENODESIS, EXTENSIVE DEBRIDEMENT;  Surgeon: Marchia Bond, MD;  Location: [facility];  Service: Orthopedics;  Laterality: Left;  ? ?Family History  ?Problem Relation Age of Onset  ? Congestive Heart Failure Mother   ? Scoliosis Mother   ? High blood pressure Mother   ? Lung cancer Father   ? COPD Father   ? Diabetes Father   ? Gout Father   ? Emphysema Father   ? Cirrhosis Brother   ? Cirrhosis Brother   ?  ?Social History  ? ?Tobacco Use  ? Smoking status: Every Day  ?  Packs/day: 0.25  ?  Years: 41.00  ?  Pack years: 10.25  ?  Types: Cigarettes  ? Smokeless tobacco: Never  ? Tobacco comments:  ?  patient stated he has cut back to 8-9 cigarettes a day  ?Substance Use Topics  ? Alcohol use: Never  ? ?Marital Status: Married  ?ROS  ?Review of Systems  ?Constitutional: Negative for malaise/fatigue and weight gain.  ?Cardiovascular:  Negative for chest pain (no recurrence), claudication, leg swelling, near-syncope, orthopnea, palpitations, paroxysmal nocturnal dyspnea and syncope.  ?Respiratory:  Positive for shortness of breath and wheezing.   ?Hematologic/Lymphatic: Does not bruise/bleed easily.  ?Musculoskeletal:  Positive for arthritis, back pain and neck pain.  ?Gastrointestinal:  Negative for melena.  ?Neurological:  Negative for dizziness and weakness.  ?Objective  ?Blood pressure Marland Kitchen)  153/92, pulse 61, temperature 98.7 ?F (37.1 ?C), temperature source Temporal, resp. rate 17, height _0  (1.753 m), weight 202 lb 6.4 oz (91.8 kg), SpO2 96 %.  ? ?  06/06/2021  ? 10:32 AM 06/06/2021  ? 10:20 AM 06/04/2020  ? 10:39 AM  ?Vitals with BMI  ?Height  _1  _2   ?Weight  202 lbs 6 oz 191 lbs 3 oz  ?BMI  29.88 28.22  ?Systolic 466 599 357  ?Diastolic 92 96 82  ?Pulse 61 59  79  ?  ? Physical Exam ?Vitals reviewed.  ?Cardiovascular:  ?   Rate and Rhythm: Normal rate and regular rhythm.  ?   Pulses: Intact distal pulses.  ?   Heart sounds: Normal heart sounds. No murmur heard. ?  No gallop.  ?   Comments: No leg edema, no JVD. ?Pulmonary:  ?   Effort: Pulmonary effort is normal.  ?   Breath sounds: Wheezing (bilateral scattered) present. No rales (bilateral scattered).  ?Musculoskeletal:  ?   Right lower leg: Edema (trace) present.  ?   Left lower leg: Edema (trace) present.  ? ?Laboratory examination:  ? ?No results for input(s): NA, K, CL, CO2, GLUCOSE, BUN, CREATININE, CALCIUM, GFRNONAA, GFRAA in the last 8760 hours. ? ?CrCl cannot be calculated (Patient's most recent lab result is older than the maximum 21 days allowed.).  ? ?  Latest Ref Rng & Units 05/18/2020  ?  9:38 AM 05/14/2020  ?  2:06 PM 04/02/2020  ?  1:55 AM  ?CMP  ?Glucose 70 - 99 mg/dL 107   94   96    ?BUN 6 - 20 mg/dL _3 ?Creatinine 0.61 - 1.24 mg/dL 1.00   1.31   1.06    ?Sodium 135 - 145 mmol/L 137   141   141    ?Potassium 3.5 - 5.1 mmol/L 4.0   4.3   3.6    ?Chloride 98 - 111 mmol/L 105   103   107    ?CO2 22 - 32 mmol/L _4 ?Calcium 8.9 - 10.3 mg/dL 8.8   9.3   8.7    ?Total Protein 6.5 - 8.1 g/dL   6.3    ?Total Bilirubin 0.3 - 1.2 mg/dL   0.5    ?Alkaline Phos 38 - 126 U/L   46    ?AST 15 - 41 U/L   15    ?ALT 0 - 44 U/L   13    ? ? ?  Latest Ref Rng & Units 05/18/2020  ?  9:38 AM 05/14/2020  ?  2:06 PM 04/02/2020  ?  1:55 AM  ?CBC  ?WBC 4.0 - 10.5 K/uL 9.1   8.3   8.1    ?Hemoglobin 13.0 - 17.0 g/dL 13.6   13.8   12.8    ?Hematocrit 39.0 - 52.0 % 41.3   39.6   39.3    ?Platelets 150 - 400 K/uL 237   218   224    ? ? ?External labs:  ? ?02/04/2020: ?Total cholesterol 163, triglycerides 60, HDL 42, LDL 109. ?TSH normal. ?Hb 13.3/HCT 39.5, platelets 172, normal indicis. ?Serum glucose 104 mg, BUN 13, creatinine 1.08, EGFR 76 mL, CMP otherwise normal. ?Uric acid 5.4. ? ?Medications and allergies   ? ?Allergies  ?Allergen Reactions  ? Hydrocodone   ?  hallucinations  ?  ? ?Outpatient  Medications Prior to Visit  ?Medication Sig  ? albuterol (VENTOLIN HFA) 108 (90 Base) MCG/ACT inhaler Inhale 2 puffs into the lungs every 4 (four) hours as needed for shortness of breath (COPD).  ? Aromatic Inhalants (VICKS VAPOR INHALER IN) Place 1 Inhaler into both nostrils daily as needed (congestion). Nose inhaler  ? atorvastatin (LIPITOR) 40 MG tablet Take 1 tablet (40 mg total) by mouth daily.  ? busPIRone (BUSPAR) 10 MG tablet Take 10 mg by mouth 3 (three) times daily.  ? gabapentin (NEURONTIN) 300 MG capsule Take 300 mg by mouth as directed. AM : 1 tablet , NOON : 1 tablet, PM 2 tablets  ? meloxicam (MOBIC) 15 MG tablet Take 15 mg by mouth daily.  ? metoprolol succinate (TOPROL-XL) 25 MG 24 hr tablet Take 25 mg by mouth daily.  ? Multiple Vitamin (MULTIVITAMIN) capsule Take 1 capsule by mouth daily. Centrum silver  ? omeprazole (PRILOSEC) 40 MG capsule Take 40 mg by mouth daily.  ? sodium chloride (OCEAN) 0.65 % SOLN nasal spray Place 1 spray into both nostrils as needed for congestion.  ? tiZANidine (ZANAFLEX) 4 MG tablet Take 4 mg by mouth 3 (three) times daily.  ? [DISCONTINUED] lisinopril (ZESTRIL) 20 MG tablet Take 20 mg by mouth daily.  ? amLODipine (NORVASC) 5 MG tablet TAKE 1 TABLET(5 MG) BY MOUTH DAILY IN THE AFTERNOON  ? budesonide-formoterol (SYMBICORT) 160-4.5 MCG/ACT inhaler Inhale 2 puffs into the lungs 2 (two) times daily.  ? KENALOG 40 MG/ML injection Inject 2 mLs into the muscle once.  ? ketorolac (TORADOL) 30 MG/ML injection Inject 30 mg into the muscle once.  ? ?No facility-administered medications prior to visit.  ? ? ?Radiology:  ? ?CT adrenal abdomen without contrast 05/13/2020: ?1. Well-circumscribed low-attenuation area arising from the RIGHT ?adrenal gland measuring 1.6 x 1.3 cm with a Hounsfield unit ?measurement less than 0 at -10 at-10. Compatible with benign adrenal ?lesion likely small  adenoma. Adrenal cyst is also considered based ?on density near 0 Hounsfield units. ?2. Bandlike airspace disease at the LEFT lung base likely scarring ?based on the appearance of the prior chest CT. ?3. Aortic atheros

## 2021-06-09 ENCOUNTER — Ambulatory Visit: Payer: Medicare HMO | Admitting: Neurology

## 2021-06-15 ENCOUNTER — Encounter: Payer: Self-pay | Admitting: *Deleted

## 2021-06-16 ENCOUNTER — Encounter: Payer: Self-pay | Admitting: Neurology

## 2021-06-16 ENCOUNTER — Ambulatory Visit: Payer: Medicare HMO | Admitting: Neurology

## 2021-06-16 VITALS — BP 125/76 | HR 58 | Ht 69.0 in | Wt 200.0 lb

## 2021-06-16 DIAGNOSIS — G4486 Cervicogenic headache: Secondary | ICD-10-CM | POA: Diagnosis not present

## 2021-06-16 DIAGNOSIS — M5481 Occipital neuralgia: Secondary | ICD-10-CM | POA: Diagnosis not present

## 2021-06-16 DIAGNOSIS — I10 Essential (primary) hypertension: Secondary | ICD-10-CM | POA: Diagnosis not present

## 2021-06-16 NOTE — Progress Notes (Addendum)
[facility]    Provider:  Dr Jaynee Eagles Requesting Provider: Laroy Apple, MD Primary Care Provider:  Monico Blitz, MD  CC:  daily headache  Addendum 06/28/2021: Requested and reviewed sleep study, Severe OSA AHI 79.7 via Dr. Merlene Laughter 11/02/2018, we discussed at appointment this can cause headaches as well and strokes and other sequelae. Needs to be compliant and f/u with sleep doctor.  HPI:  [name] [name] is a 61 y.o. male here as requested by Laroy Apple, MD for headaches.  And is referred by [facility].  He has a past medical history of hypertension, COPD, obstructive sleep apnea, unclear whether he is using CPAP or not (this can be a source of headaches).  I reviewed [facility] notes Dr. Ron Agee, his pain management physician is Dr. Manuella Ghazi, patient has neck, back pain as well as headaches.  Patient was seen last at [facility] Dr. Ron Agee in April 2023 and prior to that he was seen by Dr. Ron Agee in July 2020 at the time patient was referred for neck issues he underwent C7-T1 interlaminar epidural injection unfortunately did not get much relief with regard to his symptoms, cervical spine MRI in June 2020 demonstrating multilevel cervical spondylosis with disc osteophyte complex at C5-C6 severe left neuroforaminal stenosis as well as left-sided foraminal stenosis at C6-C7, he did see note Dr. Kathyrn Sheriff after that for surgical consultation, patient decided on conservative care, physical therapy did not help, he was seen at the [facility] neurology department, the physician assistant at [facility] neurology did not feel he needed any type of surgical intervention, he sees Dr. Manuella Ghazi for pain management periodically, he has left-sided cervical thoracic pain as well as headaches to the point where he has to wear sunglasses because bright lights exacerbate the symptoms,, no neurological evaluation for the headaches, he had limited range of motion in his neck,  appropriate strength in the bilateral upper extremities, head manipulation tends to cause headaches per patient, occipital discomfort as well as some pain that travels up to the crown of the head and right behind the eyes, he was referred to chiropractic colleagues, and referred to [place] to see if there is anything we can do for his headaches, he also had 4 family members die within the past 2 years, conservative care with chiropractic care and seeing a neurologist, they plan on an updated cervical spine MRI and Dr. Kathyrn Sheriff to have a discussion about surgical intervention for his situation, and neurology for his headaches.  Has had daily headaches for 3 years, around June 5th, he has chronic pain, work involved many years of heavy lifting. Headache starts in the neck and radiates the back of the neck and around left radiates up the head and to the temple area. Better when he frst wakes up as the day progresses the headache is worse, can be severe, puts him in bed for 3 days. Never been diagnosed with occipital neuralgia but he has significant cervical spinal disease, he also has chronic left shoulder pain, his wife is here and provides much information. He states he had the option for surgery on his neck but opted for conservative treatment in the neck instead of surgery, Dr. Kathyrn Sheriff recommended surgery on the cervical spine several years ago. He has chronic neck pain, decreased range of motion of the neck, he saw neurology at [facility] and he went to PT, he has had injections into the neck(sounds like ESI), therapy twice for his neck, prednisone, also says he had injections  in the shoulders (sounds like trigger points). Dr. Manuella Ghazi is pain management, [name] (pcp is [facility]ah is pcp). Shooting pain down left arm and arm numbness.   Reviewed notes, labs and imaging from outside physicians, which showed:   Review of Systems: Patient complains of symptoms per HPI as well as the following symptoms chronic pain.  Pertinent negatives and positives per HPI. All others negative.   Social History   Socioeconomic History   Marital status: Married    Spouse name: Not on file   Number of children: 1   Years of education: Not on file   Highest education level: Not on file  Occupational History   Occupation: Social worker  Tobacco Use   Smoking status: Every Day    Packs/day: 0.25    Years: 41.00    Pack years: 10.25    Types: Cigarettes   Smokeless tobacco: Never   Tobacco comments:    patient stated he has cut back to 8-9 cigarettes a day  Vaping Use   Vaping Use: Some days  Substance and Sexual Activity   Alcohol use: Yes    Comment: rare   Drug use: Not Currently    Comment: years ago   Sexual activity: Not on file  Other Topics Concern   Not on file  Social History Narrative   Caffiene: all day (2-3 pots (12 cups) pots daily.  Disabled.   Education [employer].  Married 1 bio child, and 2 step children.  Lives with wife and mother.    Social Determinants of Health   Financial Resource Strain: Not on file  Food Insecurity: Not on file  Transportation Needs: Not on file  Physical Activity: Not on file  Stress: Not on file  Social Connections: Not on file  Intimate Partner Violence: Not on file    Family History  Problem Relation Age of Onset   Heart failure Mother    Congestive Heart Failure Mother    Scoliosis Mother    High blood pressure Mother    Macular degeneration Mother    Lung cancer Father    COPD Father    Diabetes Father    Gout Father    Emphysema Father    Heart failure Father    Macular degeneration Brother    Cirrhosis Brother    Cirrhosis Brother    Parkinson's disease Paternal Uncle    Melanoma Maternal Grandmother    Lung cancer Paternal Grandfather    Migraines Neg Hx     Past Medical History:  Diagnosis Date   Anxiety    Arthritis    BiPAP (biphasic positive airway pressure) dependence    COPD (chronic obstructive pulmonary disease)  ([place])    Depression    Full thickness tear of left subscapularis tendon 09/05/2018   GERD (gastroesophageal reflux disease)    Hypertension    Migraine    Sleep apnea 1996   does not wear CPAP    Patient Active Problem List   Diagnosis Date Noted   Angina pectoris ([place]) 05/18/2020   Dyspnea on exertion 05/18/2020   Pneumonia 09/06/2018   COPD (chronic obstructive pulmonary disease) ([place]) 09/06/2018   Adrenal adenoma, right 09/06/2018   OSA (obstructive sleep apnea) 09/06/2018   Essential hypertension 09/06/2018   Indigestion 09/06/2018   Full thickness tear of left subscapularis tendon 09/05/2018    Past Surgical History:  Procedure Laterality Date   CARDIAC CATHETERIZATION  2010   JOINT REPLACEMENT Left    HIP  LEFT HEART CATH AND CORONARY ANGIOGRAPHY N/A 05/18/2020   Procedure: LEFT HEART CATH AND CORONARY ANGIOGRAPHY;  Surgeon: Adrian Prows, MD;  Location: [facility];  Service: Cardiovascular;  Laterality: N/A;   left hip replacement  [place] ARTHROSCOPY WITH ROTATOR CUFF REPAIR AND SUBACROMIAL DECOMPRESSION Left 09/05/2018   Procedure: LEFT SHOULDER ARTHROSCOPY WITH SUBSCAPULARIS AND SUBACROMIAL DECOMPRESSION, BICEP TENODESIS, EXTENSIVE DEBRIDEMENT;  Surgeon: Marchia Bond, MD;  Location: [facility];  Service: Orthopedics;  Laterality: Left;    Current Outpatient Medications  Medication Sig Dispense Refill   atorvastatin (LIPITOR) 40 MG tablet Take 1 tablet (40 mg total) by mouth daily. 90 tablet 3   budesonide-formoterol (SYMBICORT) 160-4.5 MCG/ACT inhaler Inhale 2 puffs into the lungs 2 (two) times daily.     busPIRone (BUSPAR) 10 MG tablet Take 10 mg by mouth 3 (three) times daily.     gabapentin (NEURONTIN) 300 MG capsule Take 300 mg by mouth as directed. AM : 1 tablet , NOON : 1 tablet, PM 2 tablets     lisinopril (ZESTRIL) 20 MG tablet Take 20 mg by mouth daily.     meloxicam (MOBIC) 15 MG tablet Take 15 mg by mouth  daily.     metoprolol succinate (TOPROL-XL) 25 MG 24 hr tablet Take 25 mg by mouth daily.     Multiple Vitamin (MULTIVITAMIN) capsule Take 1 capsule by mouth daily. Centrum silver     omeprazole (PRILOSEC) 40 MG capsule Take 40 mg by mouth daily.     sodium chloride (OCEAN) 0.65 % SOLN nasal spray Place 1 spray into both nostrils as needed for congestion.     tiZANidine (ZANAFLEX) 4 MG tablet Take 4 mg by mouth 3 (three) times daily.     No current facility-administered medications for this visit.    Allergies as of 06/16/2021 - Review Complete 06/16/2021  Allergen Reaction Noted   Hydrocodone  08/28/2018    Vitals: BP 125/76   Pulse (!) 58   Ht '5\' 9"'$  (1.753 m)   Wt 200 lb (90.7 kg)   BMI 29.53 kg/m  Last Weight:  Wt Readings from Last 1 Encounters:  06/16/21 200 lb (90.7 kg)   Last Height:   Ht Readings from Last 1 Encounters:  06/16/21 '5\' 9"'$  (1.753 m)     Physical exam: Exam: Gen: NAD, conversant                    CV: RRR, no MRG. No Carotid Bruits. No peripheral edema, warm, nontender Eyes: Conjunctivae clear without exudates or hemorrhage  Neuro: Detailed Neurologic Exam  Speech:    Speech is normal; fluent and spontaneous with normal comprehension.  Cognition:    The patient is oriented to person, place, and time;     recent and remote memory intact;     language fluent;     normal attention, concentration,     fund of knowledge Cranial Nerves:    The pupils are equal, round, and reactive to light. Pupils too small to visualize fundi. Visual fields are full to finger confrontation. Extraocular movements are intact. Trigeminal sensation is intact and the muscles of mastication are normal. Left nasolabial fold flattening (chronic after trauma and sinus surgery). The palate elevates in the midline. Hearing intact. Voice is normal. Shoulder shrug is normal. The tongue has normal motion without fasciculations.   Coordination:    Normal  Gait:    Decreased  range of motion of neck and stooped posture/neck flexion otherwise gait is normal, flat feet.   Motor Observation:    No asymmetry, no atrophy, and no involuntary movements noted. Tone:    Normal muscle tone.      Strength:    Strength is V/V in the upper and lower limbs.      Sensation: intact to LT     Reflex Exam:  DTR's:    Left biceps is depressed  Toes:    The toes are downgoing bilaterally.   Clonus:    Clonus is absent.    Assessment/Plan:  Patient with likely occipital neuralgia. Headaches are likely due to cervical degenerative disease and occipital neuralgia. Provided nerve block today but limited management options from Neurology recommend return to following physicians with these recommendations:  Addendum 06/28/2021: Requested and reviewed sleep study, Severe OSA AHI 79.7 via Dr. Merlene Laughter 11/02/2018, we discussed at appointment this can cause headaches as well and strokes and other sequelae. Needs to be compliant and f/u with sleep doctor.  Options for occipital neuralgia includes:  - C2/C3 medial branch blocks with murphy wainer - or other procedures on the occipital nerve - return to [facility] and Dr. Kathyrn Sheriff and Dr. Sherlyn Lees - Today we can provide occipital nerve blocks which are more superficial but may provide some temporary relief - Recommend back to [facility] for repeat MRI cervical spine(Dr. Ron Agee who referred patient discussed patient coming back for repeat MRi cervical spine) - pain management with Dr. Sherlyn Lees - return to [place], dr Kathyrn Sheriff, and pain management - numbness in hand/arm likely due to cervical radiculopathy, should rule out CTS recommend emg/ncs through [place] or [place] neurosurgery - MRI brain suggested, can monitor for improvement and consider as well, patient declined MRi brain due to cost at this time - consider seeing Dr. Kathyrn Sheriff again who recommended surgical intervention in the past - Pain management with  Dr. Meade Maw - follow with with Dr. Ron Agee as above at [facility] - occipital nerve block today - follow up with Dr. Sherlyn Lees, Dr. Arlana Pouch, Dr. Ron Agee, Dr. Kathyrn Sheriff   Performed by Dr. Jaynee Eagles M.D. All procedures a documented blood were medically necessary, reasonable and appropriate based on the patient's history, medical diagnosis and physician opinion. Verbal informed consent was obtained from the patient, patient was informed of potential risk of procedure, including bruising, bleeding, hematoma formation, infection, muscle weakness, muscle pain, numbness, transient hypertension, transient hyperglycemia and transient insomnia among others. All areas injected were topically clean with isopropyl rubbing alcohol. Nonsterile nonlatex gloves were worn during the procedure.  Greater occipital nerve block. The greater occipital nerve site was identified at the nuchal line medial to the occipital artery. Medication was injected into the left and right occipital nerve areas and suboccipital areas. Patient's condition is associated with inflammation of the greater occipital nerve and associated multiple groups. Injection was deemed medically necessary, reasonable and appropriate. Injection represents a separate and unique surgical service.  Cc: Dr. Sherlyn Lees, Dr. Arlana Pouch, Dr. Ron Agee, Dr. Audley Hose, MD  [facility] [address] [place], [place] [address]  Phone [phone] Fax [phone]  I spent over 70 minutes of face-to-face and non-face-to-face time with patient on the  1. Cervicogenic headache   2. Occipital neuralgia, unspecified laterality    diagnosis.  This included previsit chart review, lab review, study review, order entry, electronic health record documentation, patient education on the different diagnostic  and therapeutic options, counseling and coordination of care, risks and benefits of management, compliance, or risk  factor reduction

## 2021-06-16 NOTE — Patient Instructions (Addendum)
Options for occipital neuralgia includes:  - C2/C3 medial branch blocks with [facility] - or other procedures on the occipital nerve - return to [facility] and Dr. Kathyrn Sheriff and Dr. Sherlyn Lees - Today we can provide occipital nerve blocks which are more superficial but may provide some temporary relief - Recommend back to [facility] for repeat MRI cervical spine(Dr. Ron Agee who referred patient discussed patient coming back for repeat MRi cervical spine) - pain management with Dr. Sherlyn Lees - return to [facility], dr Kathyrn Sheriff, and pain management - numbness in hand/arm likely due to cervical radiculopathy, should rule out CTS recommend emg/ncs through [facility] or [place] [facility] - MRI brain suggested, can monitor for improvement and consider as well  CC: Dr. Sherlyn Lees, Dr. Arlana Pouch, Dr. Ron Agee, Dr. Kathyrn Sheriff      Occipital Neuralgia  Occipital neuralgia is a type of headache that causes brief episodes of very bad pain in the back of the head. Pain from occipital neuralgia may spread (radiate) to other parts of the head. These headaches may be caused by irritation of the nerves that leave the spinal cord high up in the neck, just below the base of the skull (occipital nerves). The occipital nerves transmit sensations from the back of the head, the top of the head, and the areas behind the ears. What are the causes? This condition can occur without any known cause (primary headache syndrome). In other cases, this condition is caused by pressure on or irritation of one of the two occipital nerves. Pressure and irritation may be due to: Muscle spasm in the neck. Neck injury. Wear and tear of the vertebrae in the neck (osteoarthritis). Disease of the disks that separate the vertebrae. Swollen blood vessels that put pressure on the occipital nerves. Infections. Tumors. Diabetes. What are the signs or symptoms? This condition causes brief burning, stabbing, electric,  shocking, or shooting pain in the back of the head that can radiate to the top of the head. It can happen on one side or both sides of the head. It can also cause: Pain behind the eye. Pain triggered by neck movement or hair brushing. Scalp tenderness. Aching in the back of the head between episodes of very bad pain. Pain that gets worse with exposure to bright lights. How is this diagnosed? Your health care provider may diagnose the condition based on a physical exam and your symptoms. Tests may be done, such as: Imaging studies of the brain and neck (cervical spine), such as an MRI or CT scan. These look for causes of pinched nerves. Applying pressure to the nerves in the neck to try to re-create the pain. Injection of numbing medicine into the occipital nerve areas to see if pain goes away (diagnostic nerve block). How is this treated? Treatment for this condition may begin with simple measures, such as: Rest. Massage. Applying heat or cold to the area. Over-the-counter pain relievers. If these measures do not work, you may need other treatments, including: Medicines, such as: Prescription-strength anti-inflammatory medicines. Muscle relaxants. Anti-seizure medicines, which can relieve pain. Antidepressants, which can relieve pain. Injected medicines, such as medicines that numb the area (local anesthetic) and steroids. Pulsed radiofrequency ablation. This is when wires are implanted to deliver electrical impulses that block pain signals from the occipital nerve. Surgery to relieve nerve pressure. Physical therapy. Follow these instructions at home: Managing pain     Avoid any activities that cause pain. Rest when you have an attack of pain.  Try gentle massage to relieve pain. Try a different pillow or sleeping position. If directed, apply heat to the affected area as often as told by your health care provider. Use the heat source that your health care provider recommends, such  as a moist heat pack or a heating pad. Place a towel between your skin and the heat source. Leave the heat on for 20-30 minutes. Remove the heat if your skin turns bright red. This is especially important if you are unable to feel pain, heat, or cold. You have a greater risk of getting burned. If directed, put ice on the back of your head and neck area. To do this: Put ice in a plastic bag. Place a towel between your skin and the bag. Leave the ice on for 20 minutes, 2-3 times a day. Remove the ice if your skin turns bright red. This is very important. If you cannot feel pain, heat, or cold, you have a greater risk of damage to the area. General instructions Take over-the-counter and prescription medicines only as told by your health care provider. Avoid things that make your symptoms worse, such as bright lights. Try to stay active. Get regular exercise that does not cause pain. Ask your health care provider to suggest safe exercises for you. Work with a physical therapist to learn stretching exercises you can do at home. Practice good posture. Keep all follow-up visits. This is important. Contact a health care provider if: Your medicine is not working. You have new or worsening symptoms. Get help right away if: You have very bad head pain that does not go away. You have a sudden change in vision, balance, or speech. These symptoms may represent a serious problem that is an emergency. Do not wait to see if the symptoms will go away. Get medical help right away. Call your local emergency services (911 in the [place] Do not drive yourself to the hospital. Summary Occipital neuralgia is a type of headache that causes brief episodes of very bad pain in the back of the head. Pain from occipital neuralgia may spread (radiate) to other parts of the head. Treatment for this condition includes rest, massage, and medicines. This information is not intended to replace advice given to you by your  health care provider. Make sure you discuss any questions you have with your health care provider. Document Revised: 11/09/2019 Document Reviewed: 11/09/2019 Elsevier Patient Education  [place] Tunnel Syndrome  Carpal tunnel syndrome is a condition that causes pain, numbness, and weakness in your hand and fingers. The carpal tunnel is a narrow area located on the palm side of your wrist. Repeated wrist motion or certain diseases may cause swelling within the tunnel. This swelling pinches the main nerve in the wrist. The main nerve in the wrist is called the median nerve. What are the causes? This condition may be caused by: Repeated and forceful wrist and hand motions. Wrist injuries. Arthritis. A cyst or tumor in the carpal tunnel. Fluid buildup during pregnancy. Use of tools that vibrate. Sometimes the cause of this condition is not known. What increases the risk? The following factors may make you more likely to develop this condition: Having a job that requires you to repeatedly or forcefully move your wrist or hand or requires you to use tools that vibrate. This may include jobs that involve using computers, working on an [employer], or working with [place] such as Pension scheme manager. Being a woman. Having certain  conditions, such as: Diabetes. Obesity. An underactive thyroid (hypothyroidism). Kidney failure. Rheumatoid arthritis. What are the signs or symptoms? Symptoms of this condition include: A tingling feeling in your fingers, especially in your thumb, index, and middle fingers. Tingling or numbness in your hand. An aching feeling in your entire arm, especially when your wrist and elbow are bent for a long time. Wrist pain that goes up your arm to your shoulder. Pain that goes down into your palm or fingers. A weak feeling in your hands. You may have trouble grabbing and holding items. Your symptoms may feel worse during the night. How is this  diagnosed? This condition is diagnosed with a medical history and physical exam. You may also have tests, including: Electromyogram (EMG). This test measures electrical signals sent by your nerves into the muscles. Nerve conduction study. This test measures how well electrical signals pass through your nerves. Imaging tests, such as X-rays, ultrasound, and MRI. These tests check for possible causes of your condition. How is this treated? This condition may be treated with: Lifestyle changes. It is important to stop or change the activity that caused your condition. Doing exercise and activities to strengthen and stretch your muscles and tendons (physical therapy). Making lifestyle changes to help with your condition and learning how to do your daily activities safely (occupational therapy). Medicines for pain and inflammation. This may include medicine that is injected into your wrist. A wrist splint or brace. Surgery. Follow these instructions at home: If you have a splint or brace: Wear the splint or brace as told by your health care provider. Remove it only as told by your health care provider. Loosen the splint or brace if your fingers tingle, become numb, or turn cold and blue. Keep the splint or brace clean. If the splint or brace is not waterproof: Do not let it get wet. Cover it with a watertight covering when you take a bath or shower. Managing pain, stiffness, and swelling If directed, put ice on the painful area. To do this: If you have a removeable splint or brace, remove it as told by your health care provider. Put ice in a plastic bag. Place a towel between your skin and the bag or between the splint or brace and the bag. Leave the ice on for 20 minutes, 2-3 times a day. Do not fall asleep with the cold pack on your skin. Remove the ice if your skin turns bright red. This is very important. If you cannot feel pain, heat, or cold, you have a greater risk of damage to the  area. Move your fingers often to reduce stiffness and swelling. General instructions Take over-the-counter and prescription medicines only as told by your health care provider. Rest your wrist and hand from any activity that may be causing your pain. If your condition is work related, talk with your employer about changes that can be made, such as getting a wrist pad to use while typing. Do any exercises as told by your health care provider, physical therapist, or occupational therapist. Keep all follow-up visits. This is important. Contact a health care provider if: You have new symptoms. Your pain is not controlled with medicines. Your symptoms get worse. Get help right away if: You have severe numbness or tingling in your wrist or hand. Summary Carpal tunnel syndrome is a condition that causes pain, numbness, and weakness in your hand and fingers. It is usually caused by repeated wrist motions. Lifestyle changes and medicines are used to  treat carpal tunnel syndrome. Surgery may be recommended. Follow your health care provider's instructions about wearing a splint, resting from activity, keeping follow-up visits, and calling for help. This information is not intended to replace advice given to you by your health care provider. Make sure you discuss any questions you have with your health care provider. Document Revised: 05/22/2019 Document Reviewed: 05/22/2019 Elsevier Patient Education  [place].

## 2021-06-17 ENCOUNTER — Other Ambulatory Visit: Payer: Self-pay

## 2021-06-17 DIAGNOSIS — I10 Essential (primary) hypertension: Secondary | ICD-10-CM

## 2021-06-17 LAB — BASIC METABOLIC PANEL
BUN/Creatinine Ratio: 11 (ref 9–20)
BUN: 16 mg/dL (ref 6–24)
CO2: 24 mmol/L (ref 20–29)
Calcium: 9.2 mg/dL (ref 8.7–10.2)
Chloride: 105 mmol/L (ref 96–106)
Creatinine, Ser: 1.4 mg/dL — ABNORMAL HIGH (ref 0.76–1.27)
Glucose: 122 mg/dL — ABNORMAL HIGH (ref 70–99)
Potassium: 4.4 mmol/L (ref 3.5–5.2)
Sodium: 141 mmol/L (ref 134–144)
eGFR: 58 mL/min/{1.73_m2} — ABNORMAL LOW (ref >59)

## 2021-06-17 NOTE — Progress Notes (Signed)
Called and spoke to pt regarding lab results. Pt voiced understanding and will decrease his Lisinopril to '20mg'$  daily and get labs done in 2 weeks. Orders have been placed.

## 2021-06-22 DIAGNOSIS — M542 Cervicalgia: Secondary | ICD-10-CM | POA: Diagnosis not present

## 2021-06-30 DIAGNOSIS — M542 Cervicalgia: Secondary | ICD-10-CM | POA: Diagnosis not present

## 2021-07-01 ENCOUNTER — Telehealth: Payer: Self-pay | Admitting: Student

## 2021-07-01 DIAGNOSIS — I10 Essential (primary) hypertension: Secondary | ICD-10-CM | POA: Diagnosis not present

## 2021-07-01 NOTE — Telephone Encounter (Signed)
Called and spoke to pt, pt voiced understanding. Pt has an appt with [place] 06/08/2022 @ 10:00 AM. Pt had labs done today so he will be waiting forus to review these as well.

## 2021-07-01 NOTE — Telephone Encounter (Signed)
He may go back to 20 mg. Doubt his symptoms are related to this. He can f/u with ur or PCP for hypertension in 4-6 weeks

## 2021-07-01 NOTE — Telephone Encounter (Signed)
Patient says he has been having issues with his lisinopril since increasing from 20 MG. He says he's been having headaches, frequent urination, some pain around his kidneys/lower back, and extremely thirsty. Patient wanted to make you aware. Please call him back at number provided.

## 2021-07-01 NOTE — Telephone Encounter (Signed)
Patient says he has been having issues with his lisinopril since increasing from 20 MG to 40 MG. He says he's been having headaches, frequent urination, some pressure and discomfort around his kidneys/lower back, and extremely thirsty. He stated that last night he went to the bathroom around 9 times Pt said this has never happened before. Pt stated that he has been trying to drink more water today and he has hardly been able to urinate. Pts BP while on the phone was 140/88. He did say that he was previously drinking and 2-3 pots of coffee and day. Advised pt to reduce coffee intake. Pt is unsure of what to do, please advise.

## 2021-07-02 LAB — BASIC METABOLIC PANEL
BUN/Creatinine Ratio: 11 (ref 9–20)
BUN: 15 mg/dL (ref 6–24)
CO2: 21 mmol/L (ref 20–29)
Calcium: 9.3 mg/dL (ref 8.7–10.2)
Chloride: 104 mmol/L (ref 96–106)
Creatinine, Ser: 1.39 mg/dL — ABNORMAL HIGH (ref 0.76–1.27)
Glucose: 147 mg/dL — ABNORMAL HIGH (ref 70–99)
Potassium: 3.9 mmol/L (ref 3.5–5.2)
Sodium: 142 mmol/L (ref 134–144)
eGFR: 59 mL/min/{1.73_m2} — ABNORMAL LOW (ref >59)

## 2021-07-04 NOTE — Telephone Encounter (Signed)
Please let patient know BMP is stable.

## 2021-07-05 NOTE — Telephone Encounter (Signed)
Called pt, no answer. Unable to leave vm requesting all back.

## 2021-07-06 NOTE — Telephone Encounter (Signed)
Called pt, no answer. Unable to leave vm requesting call back.

## 2021-07-07 NOTE — Telephone Encounter (Signed)
Called and spoke with patient regarding his recent BMP blood work. Patient will stay on Lisinopril '20mg'$ .

## 2021-07-08 DIAGNOSIS — M47812 Spondylosis without myelopathy or radiculopathy, cervical region: Secondary | ICD-10-CM | POA: Diagnosis not present

## 2021-07-13 DIAGNOSIS — M47812 Spondylosis without myelopathy or radiculopathy, cervical region: Secondary | ICD-10-CM | POA: Diagnosis not present

## 2021-07-21 DIAGNOSIS — Z6831 Body mass index (BMI) 31.0-31.9, adult: Secondary | ICD-10-CM | POA: Diagnosis not present

## 2021-07-21 DIAGNOSIS — I10 Essential (primary) hypertension: Secondary | ICD-10-CM | POA: Diagnosis not present

## 2021-07-21 DIAGNOSIS — R69 Illness, unspecified: Secondary | ICD-10-CM | POA: Diagnosis not present

## 2021-07-21 DIAGNOSIS — J449 Chronic obstructive pulmonary disease, unspecified: Secondary | ICD-10-CM | POA: Diagnosis not present

## 2021-07-21 DIAGNOSIS — Z299 Encounter for prophylactic measures, unspecified: Secondary | ICD-10-CM | POA: Diagnosis not present

## 2021-07-21 DIAGNOSIS — F17210 Nicotine dependence, cigarettes, uncomplicated: Secondary | ICD-10-CM | POA: Diagnosis not present

## 2021-07-21 DIAGNOSIS — M542 Cervicalgia: Secondary | ICD-10-CM | POA: Diagnosis not present

## 2021-07-21 DIAGNOSIS — M1710 Unilateral primary osteoarthritis, unspecified knee: Secondary | ICD-10-CM | POA: Diagnosis not present

## 2021-08-05 DIAGNOSIS — R69 Illness, unspecified: Secondary | ICD-10-CM | POA: Diagnosis not present

## 2021-08-05 DIAGNOSIS — F1120 Opioid dependence, uncomplicated: Secondary | ICD-10-CM | POA: Diagnosis not present

## 2021-08-05 DIAGNOSIS — M47812 Spondylosis without myelopathy or radiculopathy, cervical region: Secondary | ICD-10-CM | POA: Diagnosis not present

## 2021-08-18 DIAGNOSIS — G5603 Carpal tunnel syndrome, bilateral upper limbs: Secondary | ICD-10-CM | POA: Diagnosis not present

## 2021-08-18 DIAGNOSIS — Z299 Encounter for prophylactic measures, unspecified: Secondary | ICD-10-CM | POA: Diagnosis not present

## 2021-08-18 DIAGNOSIS — F17210 Nicotine dependence, cigarettes, uncomplicated: Secondary | ICD-10-CM | POA: Diagnosis not present

## 2021-08-18 DIAGNOSIS — R69 Illness, unspecified: Secondary | ICD-10-CM | POA: Diagnosis not present

## 2021-08-18 DIAGNOSIS — I700 Atherosclerosis of aorta: Secondary | ICD-10-CM | POA: Diagnosis not present

## 2021-08-18 DIAGNOSIS — M549 Dorsalgia, unspecified: Secondary | ICD-10-CM | POA: Diagnosis not present

## 2021-08-18 DIAGNOSIS — Z6830 Body mass index (BMI) 30.0-30.9, adult: Secondary | ICD-10-CM | POA: Diagnosis not present

## 2021-08-18 DIAGNOSIS — I10 Essential (primary) hypertension: Secondary | ICD-10-CM | POA: Diagnosis not present

## 2021-09-06 DIAGNOSIS — G4733 Obstructive sleep apnea (adult) (pediatric): Secondary | ICD-10-CM | POA: Diagnosis not present

## 2021-10-01 DIAGNOSIS — N182 Chronic kidney disease, stage 2 (mild): Secondary | ICD-10-CM | POA: Diagnosis not present

## 2021-10-01 DIAGNOSIS — J449 Chronic obstructive pulmonary disease, unspecified: Secondary | ICD-10-CM | POA: Diagnosis not present

## 2021-10-01 DIAGNOSIS — K219 Gastro-esophageal reflux disease without esophagitis: Secondary | ICD-10-CM | POA: Diagnosis not present

## 2021-10-01 DIAGNOSIS — M545 Low back pain, unspecified: Secondary | ICD-10-CM | POA: Diagnosis not present

## 2021-10-01 DIAGNOSIS — I129 Hypertensive chronic kidney disease with stage 1 through stage 4 chronic kidney disease, or unspecified chronic kidney disease: Secondary | ICD-10-CM | POA: Diagnosis not present

## 2021-10-01 DIAGNOSIS — G629 Polyneuropathy, unspecified: Secondary | ICD-10-CM | POA: Diagnosis not present

## 2021-10-01 DIAGNOSIS — E785 Hyperlipidemia, unspecified: Secondary | ICD-10-CM | POA: Diagnosis not present

## 2021-10-01 DIAGNOSIS — R69 Illness, unspecified: Secondary | ICD-10-CM | POA: Diagnosis not present

## 2021-10-01 DIAGNOSIS — Z8249 Family history of ischemic heart disease and other diseases of the circulatory system: Secondary | ICD-10-CM | POA: Diagnosis not present

## 2021-10-01 DIAGNOSIS — M1990 Unspecified osteoarthritis, unspecified site: Secondary | ICD-10-CM | POA: Diagnosis not present

## 2021-10-01 DIAGNOSIS — Z79891 Long term (current) use of opiate analgesic: Secondary | ICD-10-CM | POA: Diagnosis not present

## 2021-10-12 DIAGNOSIS — Z1283 Encounter for screening for malignant neoplasm of skin: Secondary | ICD-10-CM | POA: Diagnosis not present

## 2021-10-12 DIAGNOSIS — Z809 Family history of malignant neoplasm, unspecified: Secondary | ICD-10-CM | POA: Diagnosis not present

## 2021-10-12 DIAGNOSIS — D2339 Other benign neoplasm of skin of other parts of face: Secondary | ICD-10-CM | POA: Diagnosis not present

## 2021-10-12 DIAGNOSIS — D23121 Other benign neoplasm of skin of left upper eyelid, including canthus: Secondary | ICD-10-CM | POA: Diagnosis not present

## 2021-10-12 DIAGNOSIS — D23111 Other benign neoplasm of skin of right upper eyelid, including canthus: Secondary | ICD-10-CM | POA: Diagnosis not present

## 2021-10-12 DIAGNOSIS — D230 Other benign neoplasm of skin of lip: Secondary | ICD-10-CM | POA: Diagnosis not present

## 2021-10-12 DIAGNOSIS — Z85828 Personal history of other malignant neoplasm of skin: Secondary | ICD-10-CM | POA: Diagnosis not present

## 2021-10-12 DIAGNOSIS — D2321 Other benign neoplasm of skin of right ear and external auricular canal: Secondary | ICD-10-CM | POA: Diagnosis not present

## 2021-10-12 DIAGNOSIS — Z719 Counseling, unspecified: Secondary | ICD-10-CM | POA: Diagnosis not present

## 2021-10-12 DIAGNOSIS — D235 Other benign neoplasm of skin of trunk: Secondary | ICD-10-CM | POA: Diagnosis not present

## 2021-10-12 DIAGNOSIS — D234 Other benign neoplasm of skin of scalp and neck: Secondary | ICD-10-CM | POA: Diagnosis not present

## 2021-10-12 DIAGNOSIS — L570 Actinic keratosis: Secondary | ICD-10-CM | POA: Diagnosis not present

## 2021-10-17 DIAGNOSIS — M47812 Spondylosis without myelopathy or radiculopathy, cervical region: Secondary | ICD-10-CM | POA: Diagnosis not present

## 2021-10-17 DIAGNOSIS — G8929 Other chronic pain: Secondary | ICD-10-CM | POA: Diagnosis not present

## 2021-10-17 DIAGNOSIS — F1120 Opioid dependence, uncomplicated: Secondary | ICD-10-CM | POA: Diagnosis not present

## 2021-10-17 DIAGNOSIS — M5442 Lumbago with sciatica, left side: Secondary | ICD-10-CM | POA: Diagnosis not present

## 2021-10-17 DIAGNOSIS — R69 Illness, unspecified: Secondary | ICD-10-CM | POA: Diagnosis not present

## 2021-11-25 DIAGNOSIS — Z299 Encounter for prophylactic measures, unspecified: Secondary | ICD-10-CM | POA: Diagnosis not present

## 2021-11-25 DIAGNOSIS — Z6830 Body mass index (BMI) 30.0-30.9, adult: Secondary | ICD-10-CM | POA: Diagnosis not present

## 2021-11-25 DIAGNOSIS — F17210 Nicotine dependence, cigarettes, uncomplicated: Secondary | ICD-10-CM | POA: Diagnosis not present

## 2021-11-25 DIAGNOSIS — Z23 Encounter for immunization: Secondary | ICD-10-CM | POA: Diagnosis not present

## 2021-11-25 DIAGNOSIS — R69 Illness, unspecified: Secondary | ICD-10-CM | POA: Diagnosis not present

## 2021-11-25 DIAGNOSIS — I509 Heart failure, unspecified: Secondary | ICD-10-CM | POA: Diagnosis not present

## 2021-11-25 DIAGNOSIS — I10 Essential (primary) hypertension: Secondary | ICD-10-CM | POA: Diagnosis not present

## 2021-12-07 DIAGNOSIS — G4733 Obstructive sleep apnea (adult) (pediatric): Secondary | ICD-10-CM | POA: Diagnosis not present

## 2021-12-12 DIAGNOSIS — F1120 Opioid dependence, uncomplicated: Secondary | ICD-10-CM | POA: Diagnosis not present

## 2021-12-12 DIAGNOSIS — R202 Paresthesia of skin: Secondary | ICD-10-CM | POA: Diagnosis not present

## 2021-12-12 DIAGNOSIS — M5412 Radiculopathy, cervical region: Secondary | ICD-10-CM | POA: Diagnosis not present

## 2021-12-12 DIAGNOSIS — M47812 Spondylosis without myelopathy or radiculopathy, cervical region: Secondary | ICD-10-CM | POA: Diagnosis not present

## 2021-12-12 DIAGNOSIS — R69 Illness, unspecified: Secondary | ICD-10-CM | POA: Diagnosis not present

## 2022-02-16 DIAGNOSIS — Z0000 Encounter for general adult medical examination without abnormal findings: Secondary | ICD-10-CM | POA: Diagnosis not present

## 2022-02-16 DIAGNOSIS — R69 Illness, unspecified: Secondary | ICD-10-CM | POA: Diagnosis not present

## 2022-02-16 DIAGNOSIS — Z1339 Encounter for screening examination for other mental health and behavioral disorders: Secondary | ICD-10-CM | POA: Diagnosis not present

## 2022-02-16 DIAGNOSIS — E7800 Pure hypercholesterolemia, unspecified: Secondary | ICD-10-CM | POA: Diagnosis not present

## 2022-02-16 DIAGNOSIS — R5383 Other fatigue: Secondary | ICD-10-CM | POA: Diagnosis not present

## 2022-02-16 DIAGNOSIS — I10 Essential (primary) hypertension: Secondary | ICD-10-CM | POA: Diagnosis not present

## 2022-02-16 DIAGNOSIS — Z299 Encounter for prophylactic measures, unspecified: Secondary | ICD-10-CM | POA: Diagnosis not present

## 2022-02-16 DIAGNOSIS — Z125 Encounter for screening for malignant neoplasm of prostate: Secondary | ICD-10-CM | POA: Diagnosis not present

## 2022-02-16 DIAGNOSIS — M549 Dorsalgia, unspecified: Secondary | ICD-10-CM | POA: Diagnosis not present

## 2022-02-16 DIAGNOSIS — Z1331 Encounter for screening for depression: Secondary | ICD-10-CM | POA: Diagnosis not present

## 2022-02-16 DIAGNOSIS — Z7189 Other specified counseling: Secondary | ICD-10-CM | POA: Diagnosis not present

## 2022-02-16 DIAGNOSIS — F17210 Nicotine dependence, cigarettes, uncomplicated: Secondary | ICD-10-CM | POA: Diagnosis not present

## 2022-02-16 DIAGNOSIS — Z6830 Body mass index (BMI) 30.0-30.9, adult: Secondary | ICD-10-CM | POA: Diagnosis not present

## 2022-02-16 DIAGNOSIS — Z79899 Other long term (current) drug therapy: Secondary | ICD-10-CM | POA: Diagnosis not present

## 2022-02-27 DIAGNOSIS — F17210 Nicotine dependence, cigarettes, uncomplicated: Secondary | ICD-10-CM | POA: Diagnosis not present

## 2022-02-27 DIAGNOSIS — I509 Heart failure, unspecified: Secondary | ICD-10-CM | POA: Diagnosis not present

## 2022-02-27 DIAGNOSIS — I10 Essential (primary) hypertension: Secondary | ICD-10-CM | POA: Diagnosis not present

## 2022-02-27 DIAGNOSIS — R69 Illness, unspecified: Secondary | ICD-10-CM | POA: Diagnosis not present

## 2022-02-27 DIAGNOSIS — Z299 Encounter for prophylactic measures, unspecified: Secondary | ICD-10-CM | POA: Diagnosis not present

## 2022-02-27 DIAGNOSIS — R519 Headache, unspecified: Secondary | ICD-10-CM | POA: Diagnosis not present

## 2022-03-03 DIAGNOSIS — R930 Abnormal findings on diagnostic imaging of skull and head, not elsewhere classified: Secondary | ICD-10-CM | POA: Diagnosis not present

## 2022-03-03 DIAGNOSIS — R519 Headache, unspecified: Secondary | ICD-10-CM | POA: Diagnosis not present

## 2022-03-09 DIAGNOSIS — G4733 Obstructive sleep apnea (adult) (pediatric): Secondary | ICD-10-CM | POA: Diagnosis not present

## 2022-03-15 DIAGNOSIS — Z0000 Encounter for general adult medical examination without abnormal findings: Secondary | ICD-10-CM | POA: Diagnosis not present

## 2022-04-02 DIAGNOSIS — Z791 Long term (current) use of non-steroidal anti-inflammatories (NSAID): Secondary | ICD-10-CM | POA: Diagnosis not present

## 2022-04-02 DIAGNOSIS — M1990 Unspecified osteoarthritis, unspecified site: Secondary | ICD-10-CM | POA: Diagnosis not present

## 2022-04-02 DIAGNOSIS — I129 Hypertensive chronic kidney disease with stage 1 through stage 4 chronic kidney disease, or unspecified chronic kidney disease: Secondary | ICD-10-CM | POA: Diagnosis not present

## 2022-04-02 DIAGNOSIS — J439 Emphysema, unspecified: Secondary | ICD-10-CM | POA: Diagnosis not present

## 2022-04-02 DIAGNOSIS — R69 Illness, unspecified: Secondary | ICD-10-CM | POA: Diagnosis not present

## 2022-04-02 DIAGNOSIS — K219 Gastro-esophageal reflux disease without esophagitis: Secondary | ICD-10-CM | POA: Diagnosis not present

## 2022-04-02 DIAGNOSIS — N182 Chronic kidney disease, stage 2 (mild): Secondary | ICD-10-CM | POA: Diagnosis not present

## 2022-04-02 DIAGNOSIS — G629 Polyneuropathy, unspecified: Secondary | ICD-10-CM | POA: Diagnosis not present

## 2022-04-02 DIAGNOSIS — M545 Low back pain, unspecified: Secondary | ICD-10-CM | POA: Diagnosis not present

## 2022-04-02 DIAGNOSIS — Z87892 Personal history of anaphylaxis: Secondary | ICD-10-CM | POA: Diagnosis not present

## 2022-04-02 DIAGNOSIS — E785 Hyperlipidemia, unspecified: Secondary | ICD-10-CM | POA: Diagnosis not present

## 2022-04-02 DIAGNOSIS — F17200 Nicotine dependence, unspecified, uncomplicated: Secondary | ICD-10-CM | POA: Diagnosis not present

## 2022-04-03 DIAGNOSIS — D2322 Other benign neoplasm of skin of left ear and external auricular canal: Secondary | ICD-10-CM | POA: Diagnosis not present

## 2022-04-03 DIAGNOSIS — Z809 Family history of malignant neoplasm, unspecified: Secondary | ICD-10-CM | POA: Diagnosis not present

## 2022-04-03 DIAGNOSIS — D23111 Other benign neoplasm of skin of right upper eyelid, including canthus: Secondary | ICD-10-CM | POA: Diagnosis not present

## 2022-04-03 DIAGNOSIS — D235 Other benign neoplasm of skin of trunk: Secondary | ICD-10-CM | POA: Diagnosis not present

## 2022-04-03 DIAGNOSIS — D2321 Other benign neoplasm of skin of right ear and external auricular canal: Secondary | ICD-10-CM | POA: Diagnosis not present

## 2022-04-03 DIAGNOSIS — D230 Other benign neoplasm of skin of lip: Secondary | ICD-10-CM | POA: Diagnosis not present

## 2022-04-03 DIAGNOSIS — D2339 Other benign neoplasm of skin of other parts of face: Secondary | ICD-10-CM | POA: Diagnosis not present

## 2022-04-03 DIAGNOSIS — Z7189 Other specified counseling: Secondary | ICD-10-CM | POA: Diagnosis not present

## 2022-04-03 DIAGNOSIS — Z1283 Encounter for screening for malignant neoplasm of skin: Secondary | ICD-10-CM | POA: Diagnosis not present

## 2022-04-03 DIAGNOSIS — L820 Inflamed seborrheic keratosis: Secondary | ICD-10-CM | POA: Diagnosis not present

## 2022-04-03 DIAGNOSIS — D234 Other benign neoplasm of skin of scalp and neck: Secondary | ICD-10-CM | POA: Diagnosis not present

## 2022-04-03 DIAGNOSIS — D23121 Other benign neoplasm of skin of left upper eyelid, including canthus: Secondary | ICD-10-CM | POA: Diagnosis not present

## 2022-05-05 DIAGNOSIS — Z1330 Encounter for screening examination for mental health and behavioral disorders, unspecified: Secondary | ICD-10-CM | POA: Diagnosis not present

## 2022-05-05 DIAGNOSIS — R69 Illness, unspecified: Secondary | ICD-10-CM | POA: Diagnosis not present

## 2022-05-05 DIAGNOSIS — M47812 Spondylosis without myelopathy or radiculopathy, cervical region: Secondary | ICD-10-CM | POA: Diagnosis not present

## 2022-05-05 DIAGNOSIS — F1120 Opioid dependence, uncomplicated: Secondary | ICD-10-CM | POA: Diagnosis not present

## 2022-05-05 DIAGNOSIS — M47816 Spondylosis without myelopathy or radiculopathy, lumbar region: Secondary | ICD-10-CM | POA: Diagnosis not present

## 2022-05-05 DIAGNOSIS — M5412 Radiculopathy, cervical region: Secondary | ICD-10-CM | POA: Diagnosis not present

## 2022-05-23 DIAGNOSIS — M47812 Spondylosis without myelopathy or radiculopathy, cervical region: Secondary | ICD-10-CM | POA: Diagnosis not present

## 2022-05-23 DIAGNOSIS — F1120 Opioid dependence, uncomplicated: Secondary | ICD-10-CM | POA: Diagnosis not present

## 2022-05-23 DIAGNOSIS — M47816 Spondylosis without myelopathy or radiculopathy, lumbar region: Secondary | ICD-10-CM | POA: Diagnosis not present

## 2022-05-26 ENCOUNTER — Encounter: Payer: Self-pay | Admitting: Student

## 2022-05-26 ENCOUNTER — Encounter: Payer: Self-pay | Admitting: Cardiology

## 2022-05-26 ENCOUNTER — Other Ambulatory Visit: Payer: Self-pay

## 2022-05-26 DIAGNOSIS — R0609 Other forms of dyspnea: Secondary | ICD-10-CM

## 2022-05-26 DIAGNOSIS — I10 Essential (primary) hypertension: Secondary | ICD-10-CM

## 2022-05-26 DIAGNOSIS — I77810 Thoracic aortic ectasia: Secondary | ICD-10-CM

## 2022-05-26 NOTE — Progress Notes (Signed)
ICD-10-CM   1. Dyspnea on exertion  R06.09 PCV ECHOCARDIOGRAM COMPLETE    2. Essential hypertension  I10 PCV ECHOCARDIOGRAM COMPLETE

## 2022-06-08 ENCOUNTER — Ambulatory Visit: Payer: Medicare HMO | Admitting: Student

## 2022-06-20 DIAGNOSIS — G4733 Obstructive sleep apnea (adult) (pediatric): Secondary | ICD-10-CM | POA: Diagnosis not present

## 2022-06-21 DIAGNOSIS — I10 Essential (primary) hypertension: Secondary | ICD-10-CM | POA: Diagnosis not present

## 2022-06-21 DIAGNOSIS — Z299 Encounter for prophylactic measures, unspecified: Secondary | ICD-10-CM | POA: Diagnosis not present

## 2022-06-21 DIAGNOSIS — I509 Heart failure, unspecified: Secondary | ICD-10-CM | POA: Diagnosis not present

## 2022-06-21 DIAGNOSIS — J439 Emphysema, unspecified: Secondary | ICD-10-CM | POA: Diagnosis not present

## 2022-06-21 DIAGNOSIS — M542 Cervicalgia: Secondary | ICD-10-CM | POA: Diagnosis not present

## 2022-06-21 DIAGNOSIS — F17210 Nicotine dependence, cigarettes, uncomplicated: Secondary | ICD-10-CM | POA: Diagnosis not present

## 2022-07-03 ENCOUNTER — Ambulatory Visit: Payer: Medicare HMO

## 2022-07-03 DIAGNOSIS — I10 Essential (primary) hypertension: Secondary | ICD-10-CM

## 2022-07-03 DIAGNOSIS — R0609 Other forms of dyspnea: Secondary | ICD-10-CM | POA: Diagnosis not present

## 2022-07-06 NOTE — Progress Notes (Signed)
Echocardiogram 07/03/2022: Normal LV systolic function with visual EF 60-65%. Left ventricle cavity is normal in size. Normal left ventricular wall thickness. Normal global wall motion. Normal diastolic filling pattern, normal LAP. 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 Proximal ascending aorta dilated, 40 mm. No significant valvular heart disease. Prior study 03/24/2021 LVEF 60 to 65%, sinus of Valsalva 39 mm, sinotubular junction 37 mm, proximal ascending aorta 38 mm.  Otherwise no significant change.  Stable echo, discuss on OV soon

## 2022-07-11 ENCOUNTER — Encounter: Payer: Self-pay | Admitting: Cardiology

## 2022-07-11 ENCOUNTER — Ambulatory Visit: Payer: Medicare HMO | Admitting: Cardiology

## 2022-07-11 VITALS — BP 113/73 | Ht 69.0 in | Wt 205.6 lb

## 2022-07-11 DIAGNOSIS — I77810 Thoracic aortic ectasia: Secondary | ICD-10-CM | POA: Diagnosis not present

## 2022-07-11 DIAGNOSIS — R0609 Other forms of dyspnea: Secondary | ICD-10-CM | POA: Diagnosis not present

## 2022-07-11 DIAGNOSIS — I10 Essential (primary) hypertension: Secondary | ICD-10-CM

## 2022-07-11 DIAGNOSIS — F17200 Nicotine dependence, unspecified, uncomplicated: Secondary | ICD-10-CM
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
## 2022-08-09 DIAGNOSIS — Z0120 Encounter for dental examination and cleaning without abnormal findings: Secondary | ICD-10-CM | POA: Diagnosis not present

## 2022-08-22 DIAGNOSIS — F1120 Opioid dependence, uncomplicated: Secondary | ICD-10-CM | POA: Diagnosis not present

## 2022-08-22 DIAGNOSIS — M47812 Spondylosis without myelopathy or radiculopathy, cervical region: Secondary | ICD-10-CM | POA: Diagnosis not present

## 2022-08-22 DIAGNOSIS — M47816 Spondylosis without myelopathy or radiculopathy, lumbar region: Secondary | ICD-10-CM | POA: Diagnosis not present

## 2022-08-22 DIAGNOSIS — M5442 Lumbago with sciatica, left side: Secondary | ICD-10-CM | POA: Diagnosis not present

## 2022-08-22 DIAGNOSIS — G8929 Other chronic pain: Secondary | ICD-10-CM | POA: Diagnosis not present

## 2022-10-03 DIAGNOSIS — G4733 Obstructive sleep apnea (adult) (pediatric): Secondary | ICD-10-CM | POA: Diagnosis not present

## 2022-10-09 DIAGNOSIS — D23111 Other benign neoplasm of skin of right upper eyelid, including canthus: Secondary | ICD-10-CM | POA: Diagnosis not present

## 2022-10-09 DIAGNOSIS — D23121 Other benign neoplasm of skin of left upper eyelid, including canthus: Secondary | ICD-10-CM | POA: Diagnosis not present

## 2022-10-09 DIAGNOSIS — Z7189 Other specified counseling: Secondary | ICD-10-CM | POA: Diagnosis not present

## 2022-10-09 DIAGNOSIS — D2361 Other benign neoplasm of skin of right upper limb, including shoulder: Secondary | ICD-10-CM | POA: Diagnosis not present

## 2022-10-09 DIAGNOSIS — Z809 Family history of malignant neoplasm, unspecified: Secondary | ICD-10-CM | POA: Diagnosis not present

## 2022-10-09 DIAGNOSIS — D2322 Other benign neoplasm of skin of left ear and external auricular canal: Secondary | ICD-10-CM | POA: Diagnosis not present

## 2022-10-09 DIAGNOSIS — D2362 Other benign neoplasm of skin of left upper limb, including shoulder: Secondary | ICD-10-CM | POA: Diagnosis not present

## 2022-10-09 DIAGNOSIS — D2321 Other benign neoplasm of skin of right ear and external auricular canal: Secondary | ICD-10-CM | POA: Diagnosis not present

## 2022-10-09 DIAGNOSIS — D2339 Other benign neoplasm of skin of other parts of face: Secondary | ICD-10-CM | POA: Diagnosis not present

## 2022-10-09 DIAGNOSIS — D234 Other benign neoplasm of skin of scalp and neck: Secondary | ICD-10-CM | POA: Diagnosis not present

## 2022-10-09 DIAGNOSIS — D230 Other benign neoplasm of skin of lip: Secondary | ICD-10-CM | POA: Diagnosis not present

## 2022-10-09 DIAGNOSIS — Z1283 Encounter for screening for malignant neoplasm of skin: Secondary | ICD-10-CM | POA: Diagnosis not present

## 2022-11-21 DIAGNOSIS — K08409 Partial loss of teeth, unspecified cause, unspecified class: Secondary | ICD-10-CM | POA: Diagnosis not present

## 2022-11-22 DIAGNOSIS — F1120 Opioid dependence, uncomplicated: Secondary | ICD-10-CM | POA: Diagnosis not present

## 2022-11-22 DIAGNOSIS — M47812 Spondylosis without myelopathy or radiculopathy, cervical region: Secondary | ICD-10-CM | POA: Diagnosis not present

## 2022-11-22 DIAGNOSIS — G8929 Other chronic pain: Secondary | ICD-10-CM | POA: Diagnosis not present

## 2022-11-22 DIAGNOSIS — M25552 Pain in left hip: Secondary | ICD-10-CM | POA: Diagnosis not present

## 2022-11-22 DIAGNOSIS — M25551 Pain in right hip: Secondary | ICD-10-CM | POA: Diagnosis not present

## 2022-11-22 DIAGNOSIS — M5442 Lumbago with sciatica, left side: Secondary | ICD-10-CM | POA: Diagnosis not present

## 2022-11-22 DIAGNOSIS — M47816 Spondylosis without myelopathy or radiculopathy, lumbar region: Secondary | ICD-10-CM | POA: Diagnosis not present

## 2022-11-24 IMAGING — CT CT ABDOMEN W/O CM
1 of 2 series · 14 of 32 positions shown, 19 images · non-contrast
Comparison: September 06, 2018

CLINICAL DATA: Adrenal mass follow-up evaluation.

EXAM:
CT ABDOMEN WITHOUT CONTRAST
TECHNIQUE: Multidetector CT imaging of the abdomen was performed following the
standard protocol without IV contrast.

[Series 2: adrenal w/(date) · axial · 0.78mm/px · z∈[-328,-82]mm · 14 of 94 slices shown, 19 images]
[im 6/94  soft-tissue]
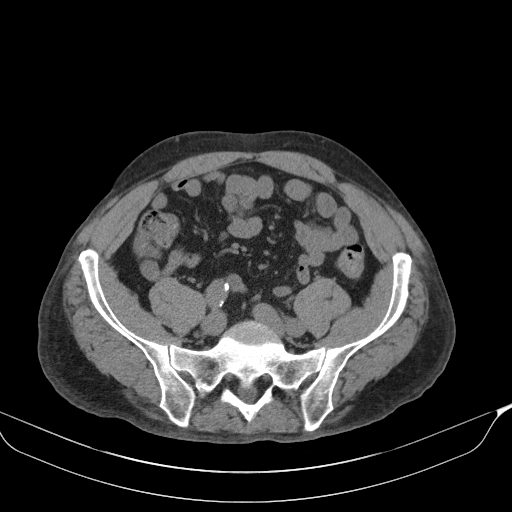
[im 6/94  bone]
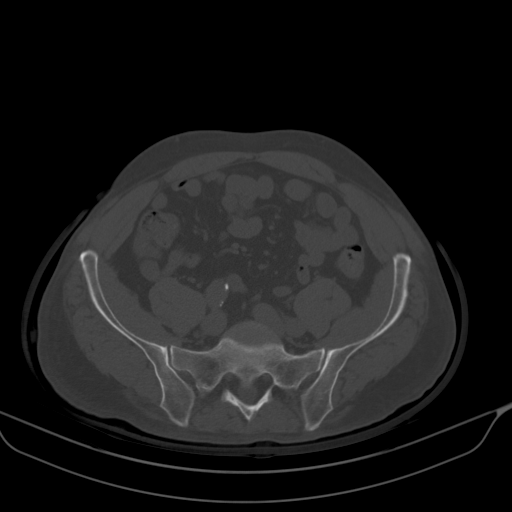
[im 11/94  soft-tissue]
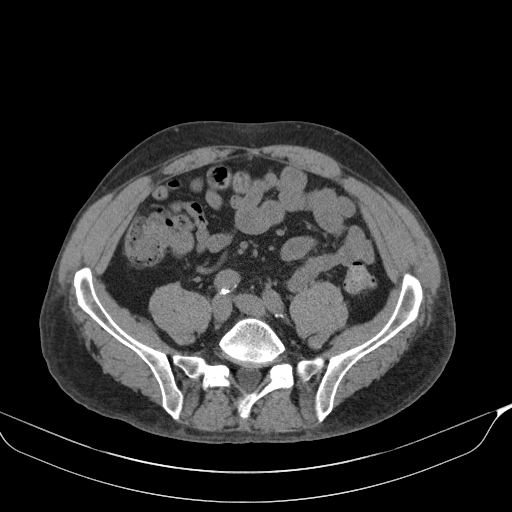
[im 21/94  soft-tissue]
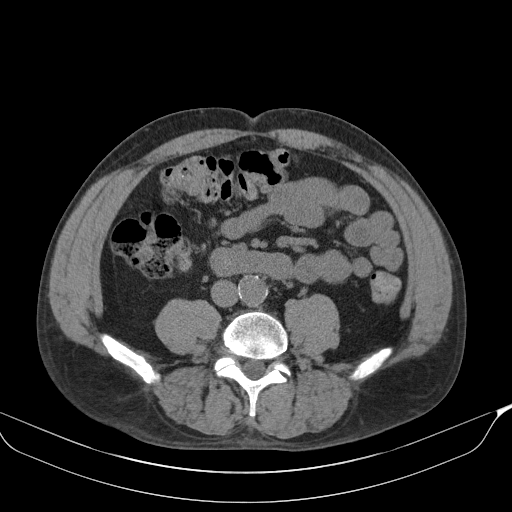
[im 26/94  soft-tissue]
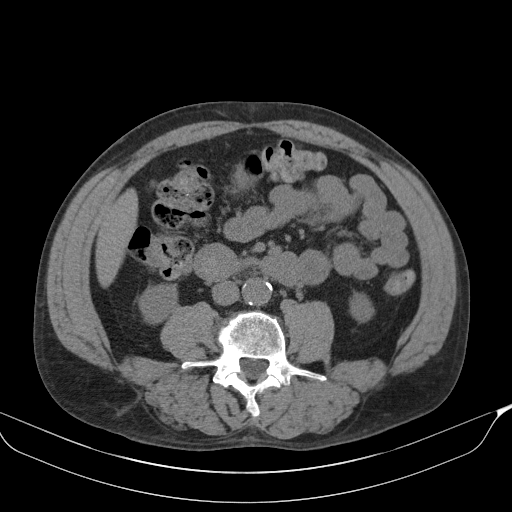
[im 32/94  soft-tissue]
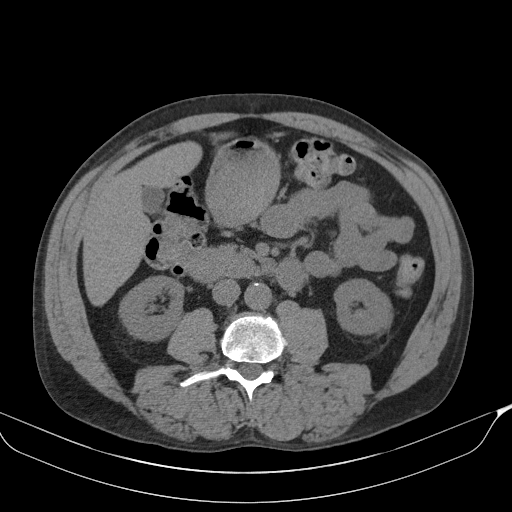
[im 42/94  soft-tissue]
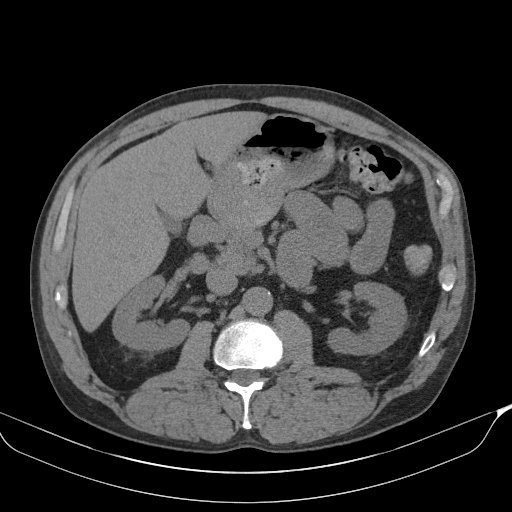
[im 47/94  soft-tissue]
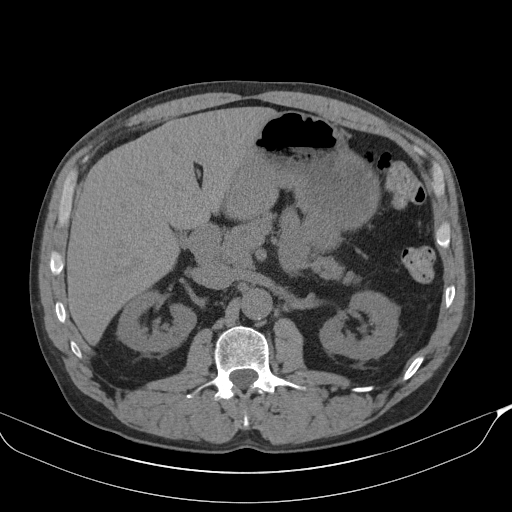
[im 52/94  soft-tissue]
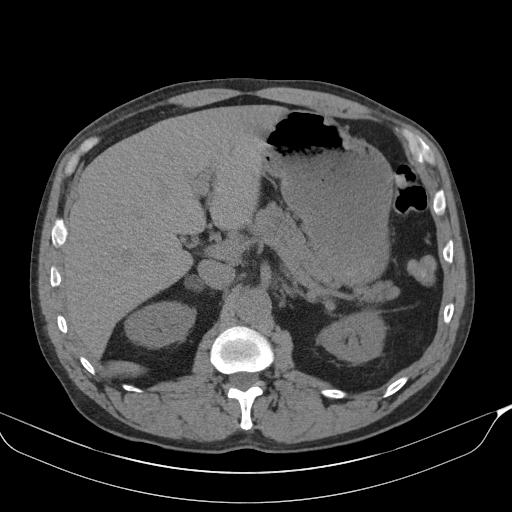
[im 63/94  soft-tissue]
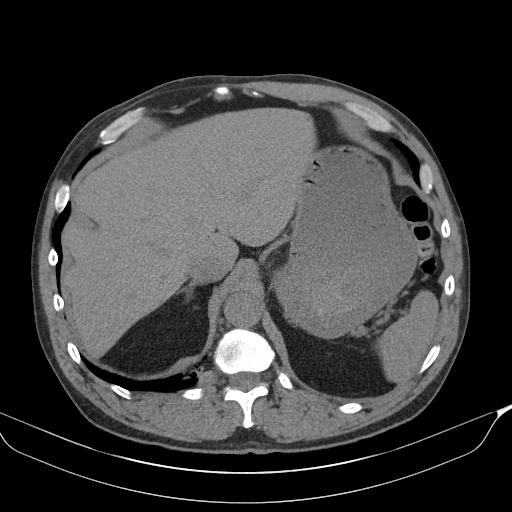
[im 63/94  bone]
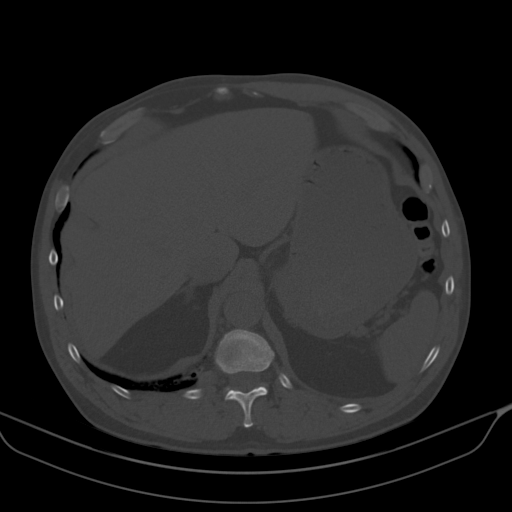
[im 68/94  soft-tissue]
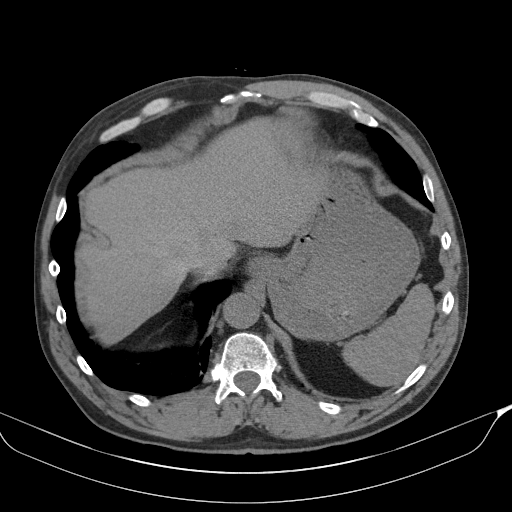
[im 73/94  soft-tissue]
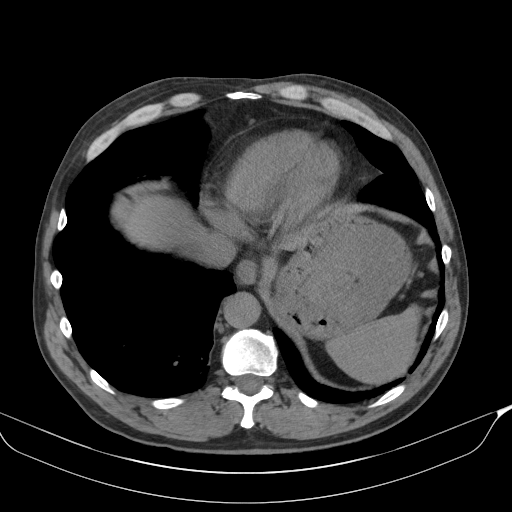
[im 73/94  lung]
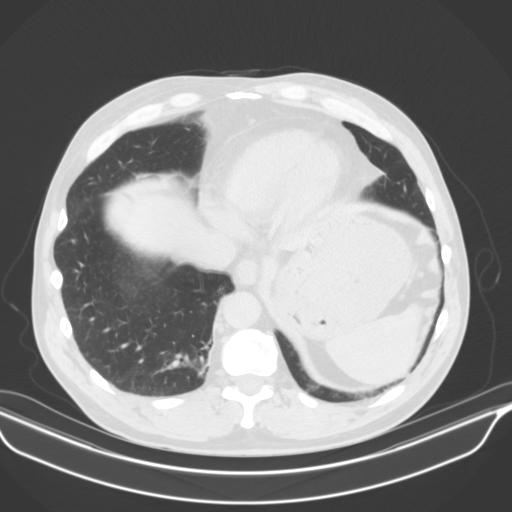
[im 78/94  lung]
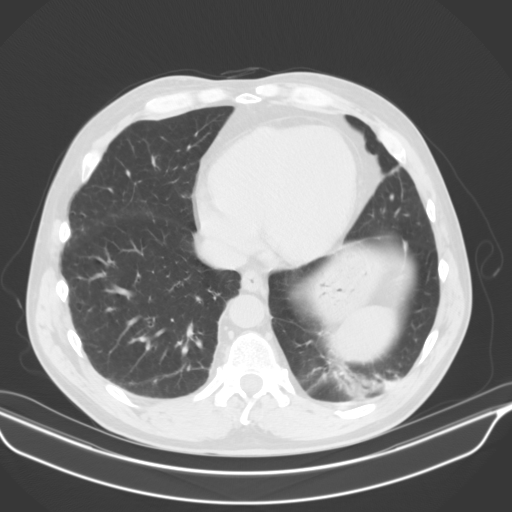
[im 83/94  soft-tissue]
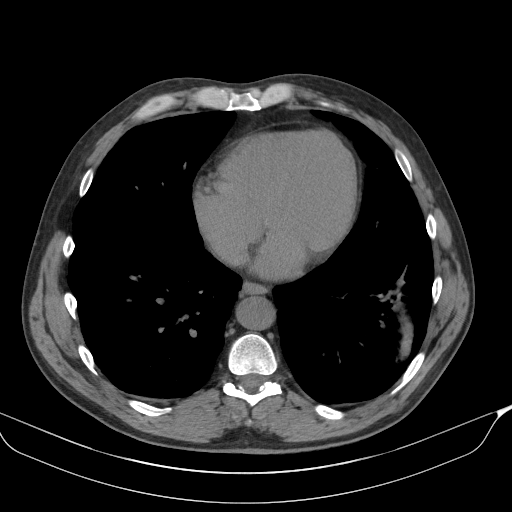
[im 83/94  lung]
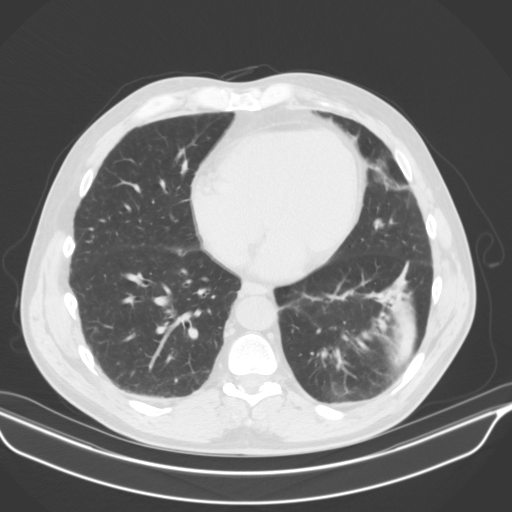
[im 88/94  soft-tissue]
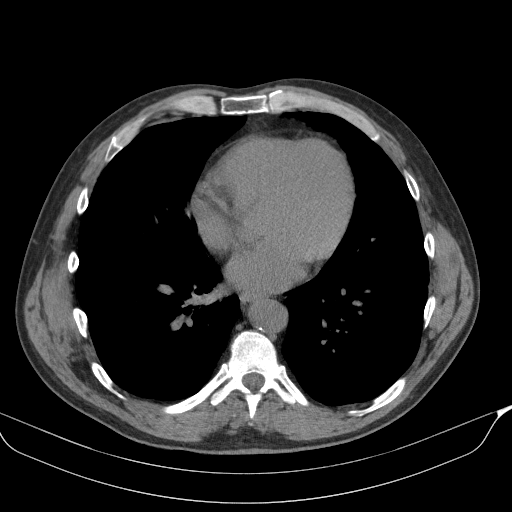
[im 88/94  lung]
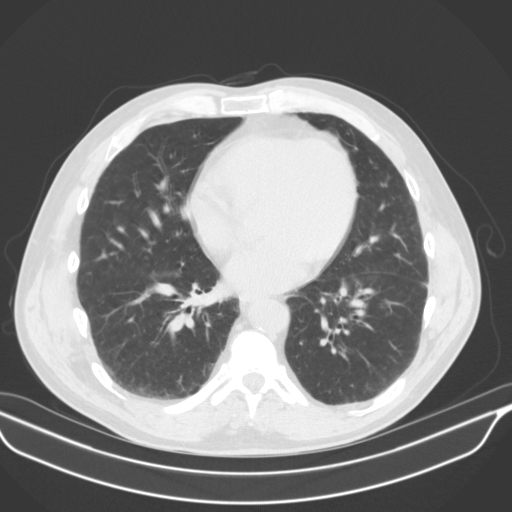

[14 of 32 positions shown; findings below may reference images not displayed]

FINDINGS: Lower chest: Incidental imaging of the lung bases with crescentic
bandlike consolidative process in the LEFT lung base, previously
there was diffuse airspace disease in this location. There is no
pleural effusion or new area of consolidation with mild
bronchiectasis at the RIGHT lung base.

Hepatobiliary: Liver contour and size are normal. No pericholecystic
stranding.

Pancreas: Pancreas with normal contour.  No signs of inflammation.

Spleen: Spleen normal size and contour.

Adrenals/Urinary Tract: Adrenal glands with normal LEFT adrenal.

Well-circumscribed low-attenuation area arising from the RIGHT
adrenal gland measuring 1.6 x 1.3 cm with a Hounsfield unit
measurement less than 0 at -10 no new area of concern in the adrenal
glands. Mild perinephric stranding. Normal contour of the bilateral
kidneys without hydronephrosis. Perinephric stranding was present
previously. No nephrolithiasis.

Stomach/Bowel: No acute gastrointestinal process to the extent
visualized. Pelvic bowel loops are not imaged.

Vascular/Lymphatic: Scattered atheromatous plaque in the
nonaneurysmal abdominal aorta tracking in the iliac vessels. Smooth
contour of the IVC. There is no gastrohepatic or hepatoduodenal
ligament lymphadenopathy. No retroperitoneal or mesenteric
lymphadenopathy.

Other: No ascites

Musculoskeletal: No acute or significant osseous findings.
IMPRESSION: 1. Well-circumscribed low-attenuation area arising from the RIGHT
adrenal gland measuring 1.6 x 1.3 cm with a Hounsfield unit
measurement less than 0 at -10 at-24. Compatible with benign adrenal
lesion likely small adenoma. Adrenal cyst is also considered based
on density near 0 Hounsfield units.
2. Bandlike airspace disease at the LEFT lung base likely scarring
based on the appearance of the prior chest CT.
3. Aortic atherosclerosis.

Aortic Atherosclerosis ([id number].H).

## 2022-12-05 DIAGNOSIS — D230 Other benign neoplasm of skin of lip: Secondary | ICD-10-CM | POA: Diagnosis not present

## 2022-12-05 DIAGNOSIS — D234 Other benign neoplasm of skin of scalp and neck: Secondary | ICD-10-CM | POA: Diagnosis not present

## 2022-12-05 DIAGNOSIS — Z1283 Encounter for screening for malignant neoplasm of skin: Secondary | ICD-10-CM | POA: Diagnosis not present

## 2022-12-05 DIAGNOSIS — R52 Pain, unspecified: Secondary | ICD-10-CM | POA: Diagnosis not present

## 2022-12-05 DIAGNOSIS — D2339 Other benign neoplasm of skin of other parts of face: Secondary | ICD-10-CM | POA: Diagnosis not present

## 2022-12-05 DIAGNOSIS — D2322 Other benign neoplasm of skin of left ear and external auricular canal: Secondary | ICD-10-CM | POA: Diagnosis not present

## 2022-12-05 DIAGNOSIS — D2321 Other benign neoplasm of skin of right ear and external auricular canal: Secondary | ICD-10-CM | POA: Diagnosis not present

## 2022-12-05 DIAGNOSIS — D23111 Other benign neoplasm of skin of right upper eyelid, including canthus: Secondary | ICD-10-CM | POA: Diagnosis not present

## 2022-12-05 DIAGNOSIS — Z809 Family history of malignant neoplasm, unspecified: Secondary | ICD-10-CM | POA: Diagnosis not present

## 2022-12-05 DIAGNOSIS — D23121 Other benign neoplasm of skin of left upper eyelid, including canthus: Secondary | ICD-10-CM | POA: Diagnosis not present

## 2022-12-05 DIAGNOSIS — Z7189 Other specified counseling: Secondary | ICD-10-CM | POA: Diagnosis not present

## 2022-12-05 DIAGNOSIS — L723 Sebaceous cyst: Secondary | ICD-10-CM | POA: Diagnosis not present

## 2023-02-07 DIAGNOSIS — G4733 Obstructive sleep apnea (adult) (pediatric): Secondary | ICD-10-CM | POA: Diagnosis not present

## 2023-02-21 DIAGNOSIS — Z1330 Encounter for screening examination for mental health and behavioral disorders, unspecified: Secondary | ICD-10-CM | POA: Diagnosis not present

## 2023-02-21 DIAGNOSIS — F1120 Opioid dependence, uncomplicated: Secondary | ICD-10-CM | POA: Diagnosis not present

## 2023-02-21 DIAGNOSIS — M47816 Spondylosis without myelopathy or radiculopathy, lumbar region: Secondary | ICD-10-CM | POA: Diagnosis not present

## 2023-02-21 DIAGNOSIS — M47812 Spondylosis without myelopathy or radiculopathy, cervical region: Secondary | ICD-10-CM | POA: Diagnosis not present

## 2023-05-03 DIAGNOSIS — G4733 Obstructive sleep apnea (adult) (pediatric): Secondary | ICD-10-CM | POA: Diagnosis not present

## 2023-05-15 DIAGNOSIS — M47816 Spondylosis without myelopathy or radiculopathy, lumbar region: Secondary | ICD-10-CM | POA: Diagnosis not present

## 2023-05-15 DIAGNOSIS — M25551 Pain in right hip: Secondary | ICD-10-CM | POA: Diagnosis not present

## 2023-05-15 DIAGNOSIS — F1120 Opioid dependence, uncomplicated: Secondary | ICD-10-CM | POA: Diagnosis not present

## 2023-05-15 DIAGNOSIS — M25552 Pain in left hip: Secondary | ICD-10-CM | POA: Diagnosis not present

## 2023-05-15 DIAGNOSIS — M47812 Spondylosis without myelopathy or radiculopathy, cervical region: Secondary | ICD-10-CM | POA: Diagnosis not present

## 2023-05-21 DIAGNOSIS — D23111 Other benign neoplasm of skin of right upper eyelid, including canthus: Secondary | ICD-10-CM | POA: Diagnosis not present

## 2023-05-21 DIAGNOSIS — Z7189 Other specified counseling: Secondary | ICD-10-CM | POA: Diagnosis not present

## 2023-05-21 DIAGNOSIS — D2322 Other benign neoplasm of skin of left ear and external auricular canal: Secondary | ICD-10-CM | POA: Diagnosis not present

## 2023-05-21 DIAGNOSIS — D23121 Other benign neoplasm of skin of left upper eyelid, including canthus: Secondary | ICD-10-CM | POA: Diagnosis not present

## 2023-05-21 DIAGNOSIS — D230 Other benign neoplasm of skin of lip: Secondary | ICD-10-CM | POA: Diagnosis not present

## 2023-05-21 DIAGNOSIS — D2321 Other benign neoplasm of skin of right ear and external auricular canal: Secondary | ICD-10-CM | POA: Diagnosis not present

## 2023-05-21 DIAGNOSIS — D2361 Other benign neoplasm of skin of right upper limb, including shoulder: Secondary | ICD-10-CM | POA: Diagnosis not present

## 2023-05-21 DIAGNOSIS — L570 Actinic keratosis: Secondary | ICD-10-CM | POA: Diagnosis not present

## 2023-05-21 DIAGNOSIS — Z809 Family history of malignant neoplasm, unspecified: Secondary | ICD-10-CM | POA: Diagnosis not present

## 2023-05-21 DIAGNOSIS — L821 Other seborrheic keratosis: Secondary | ICD-10-CM | POA: Diagnosis not present

## 2023-05-21 DIAGNOSIS — D234 Other benign neoplasm of skin of scalp and neck: Secondary | ICD-10-CM | POA: Diagnosis not present

## 2023-05-21 DIAGNOSIS — D2339 Other benign neoplasm of skin of other parts of face: Secondary | ICD-10-CM | POA: Diagnosis not present

## 2023-05-21 DIAGNOSIS — D485 Neoplasm of uncertain behavior of skin: Secondary | ICD-10-CM | POA: Diagnosis not present

## 2023-05-21 DIAGNOSIS — Z1283 Encounter for screening for malignant neoplasm of skin: Secondary | ICD-10-CM | POA: Diagnosis not present

## 2023-06-23 DIAGNOSIS — H52209 Unspecified astigmatism, unspecified eye: Secondary | ICD-10-CM | POA: Diagnosis not present

## 2023-06-23 DIAGNOSIS — H524 Presbyopia: Secondary | ICD-10-CM | POA: Diagnosis not present

## 2023-06-23 DIAGNOSIS — H5203 Hypermetropia, bilateral: Secondary | ICD-10-CM | POA: Diagnosis not present

## 2023-07-03 ENCOUNTER — Other Ambulatory Visit: Payer: Medicare HMO

## 2023-07-03 ENCOUNTER — Ambulatory Visit (HOSPITAL_COMMUNITY)
Admission: RE | Admit: 2023-07-03 | Discharge: 2023-07-03 | Disposition: A | Discharge status: Home / Self Care | Payer: Self-pay | Source: Ambulatory Visit | Attending: Cardiovascular Disease | Admitting: Cardiovascular Disease

## 2023-07-03 ENCOUNTER — Ambulatory Visit: Payer: Self-pay | Admitting: Cardiology

## 2023-07-03 DIAGNOSIS — I420 Dilated cardiomyopathy: Secondary | ICD-10-CM

## 2023-07-03 DIAGNOSIS — R0609 Other forms of dyspnea: Secondary | ICD-10-CM

## 2023-07-03 DIAGNOSIS — I77810 Thoracic aortic ectasia: Secondary | ICD-10-CM

## 2023-07-03 LAB — ECHOCARDIOGRAM COMPLETE
Area-P 1/2: 5.07 cm2
S' Lateral: 4.1 cm

## 2023-07-03 NOTE — Progress Notes (Signed)
 His LVEF has decreased from normal to mild decrease and also has RV systolic function is also reduced probably related to obesity and continued tobacco use disorder as he does not have any blockages in his heart.  Aortic root dilatation is very stable and mild and no further evaluation is indicated.  I would like to see him back in 6 months with repeat echocardiogram instead of seeing him back in a year.  I have placed orders for echocardiogram.

## 2023-07-12 ENCOUNTER — Ambulatory Visit: Payer: Medicare HMO | Admitting: Cardiology

## 2023-08-24 ENCOUNTER — Other Ambulatory Visit (HOSPITAL_COMMUNITY): Payer: Self-pay

## 2023-08-24 ENCOUNTER — Encounter: Payer: Self-pay | Admitting: Cardiology

## 2023-08-24 ENCOUNTER — Ambulatory Visit: Attending: Cardiology | Admitting: Cardiology

## 2023-08-24 VITALS — BP 139/88 | HR 85 | Resp 16 | Ht 69.0 in | Wt 214.6 lb

## 2023-08-24 DIAGNOSIS — I4819 Other persistent atrial fibrillation: Secondary | ICD-10-CM | POA: Diagnosis not present

## 2023-08-24 DIAGNOSIS — F17200 Nicotine dependence, unspecified, uncomplicated: Secondary | ICD-10-CM | POA: Diagnosis not present

## 2023-08-24 DIAGNOSIS — I77810 Thoracic aortic ectasia: Secondary | ICD-10-CM

## 2023-08-24 DIAGNOSIS — I10 Essential (primary) hypertension: Secondary | ICD-10-CM | POA: Diagnosis not present

## 2023-08-24 DIAGNOSIS — I428 Other cardiomyopathies: Secondary | ICD-10-CM | POA: Diagnosis not present

## 2023-08-24 MED ORDER — RIVAROXABAN 20 MG PO TABS: 20.0000 mg | ORAL_TABLET | Freq: Every day | ORAL | 2 refills | Status: DC

## 2023-08-24 MED ORDER — VALSARTAN 160 MG PO TABS
160.0000 mg | ORAL_TABLET | Freq: Every day | ORAL | 1 refills | Status: DC
  Filled 2023-08-24: qty 30, 30d supply, fill #0

## 2023-08-24 MED ORDER — METOPROLOL SUCCINATE ER 50 MG PO TB24
50.0000 mg | ORAL_TABLET | Freq: Every evening | ORAL | 1 refills | Status: DC
  Filled 2023-08-24: qty 90, 90d supply, fill #0
  Filled 2023-12-29: qty 90, 90d supply, fill #1

## 2023-08-24 NOTE — Progress Notes (Signed)
 Cardiology Office Note:  .   Date:  08/24/2023  ID:  [name] [name], DOB 1960-03-02, MRN [id number] PCP: Maree Isles, MD  [facility] Providers Cardiologist:  Gordy Bergamo, MD   History of Present Illness: .   [name] [name] is a 63 y.o. Caucasian male patient with heavy tobacco use disorder (>41-pack-year history), COPD with centrilobular and paraseptal emphysema, disabled due to spinal stenosis and chronic back pain and COPD with severe chronic dyspnea, severe obstructive sleep apnea on BiPAP, hypertension.   Left heart catheterization coronary angiography 05/18/2020 which revealed no significant coronary artery disease echocardiogram on 07/03/2022 had revealed mildly dilated sinus of Valsalva at 42 and sinotubular junction at 41 mm.  He now presents for follow-up of the same.  Echocardiogram done 07/03/2023 ascending aortic measurement of 39 mm however LVEF reduced to 40 to 45% compared to 07/05/2022 and 03/26/2021 echo. Also reduced RV function new.  Aortic root minimal dilatation stable from 2025 and 03/24/2021.    Patient now presents for annual follow-up of aortic root dilation.  Discussed the use of AI scribe software for clinical note transcription with the patient, who gave verbal consent to proceed.  History of Present Illness [name] [name] is a 63 year old male with severe sleep apnea and non-ischemic cardiomyopathy who presents with fatigue and neck pain.  He experiences fatigue, particularly in the afternoons, and has severe sleep apnea. He uses a CPAP machine but struggles with consistent use due to sinus issues and nasal congestion. He has reduced smoking to less than half a pack a day from nearly two packs a day. Swelling is present in his feet, more noticeable at night, and occasionally extends to his legs up to his knees. He experiences lightheadedness when standing up suddenly and bruises easily, particularly when bumping into furniture. He experiences cramps at night  and in the early morning, especially when getting up to use the restroom. He remains active, walking a couple of miles a day and working in his [employer], without experiencing leg cramps during these activities.  Labs   External Labs:  Care everywhere PCP labs 02/20/2023:  Hb 14.0/HCT 40.6, platelets 211.  Normal indicis.  Serum glucose 105 mg, BUN 23, creatinine 1.14, EGFR 74 mL, potassium 4.5, LFTs normal.  Total cholesterol 96, triglycerides 102, HDL 32, LDL 45.  TSH normal at 1.06.  ROS  Review of Systems  Cardiovascular:  Positive for dyspnea on exertion (chronic) and leg swelling (mild). Negative for chest pain.   Physical Exam:   VS:  BP 139/88 (BP Location: Left Arm, Patient Position: Sitting, Cuff Size: Large)   Pulse 85   Resp 16   Ht 5' 9 (1.753 m)   Wt 214 lb 9.6 oz (97.3 kg)   SpO2 97%   BMI 31.69 kg/m    Wt Readings from Last 3 Encounters:  08/24/23 214 lb 9.6 oz (97.3 kg)  07/11/22 205 lb 9.6 oz (93.3 kg)  06/16/21 200 lb (90.7 kg)    Orthostatic VS for the past 24 hrs (Last 3 readings):  BP- Lying Pulse- Lying BP- Sitting Pulse- Sitting BP- Standing at 0 minutes Pulse- Standing at 0 minutes  08/24/23 1345 (!) 139/96 82 (!) 134/96 78 (!) 129/92 57   Physical Exam Neck:     Vascular: No carotid bruit or JVD.  Cardiovascular:     Rate and Rhythm: Normal rate. Rhythm irregularly irregular.     Pulses:  Dorsalis pedis pulses are 1+ on the right side and 1+ on the left side.       Posterior tibial pulses are 0 on the right side and 0 on the left side.     Heart sounds: Normal heart sounds. No murmur heard.    No gallop.  Pulmonary:     Effort: Pulmonary effort is normal.     Breath sounds: Normal breath sounds.  Abdominal:     General: Bowel sounds are normal.     Palpations: Abdomen is soft.  Musculoskeletal:     Right lower leg: No edema.     Left lower leg: No edema.    Studies Reviewed: SABRA    ECHOCARDIOGRAM COMPLETE 07/03/2023   1. Left ventricular ejection fraction, by estimation, is 40 to 45%. The left ventricle has mildly decreased function. The left ventricle demonstrates global hypokinesis. The left ventricular internal cavity size was upper limit of normal. There is mild left ventricular hypertrophy. Left ventricular diastolic parameters are consistent with Grade I diastolic dysfunction (impaired relaxation). 2. Right ventricular systolic function is mildly reduced. The right ventricular size is normal. There is normal pulmonary artery systolic pressure. 3. Left atrial size was moderately dilated. 4. Right atrial size was mildly dilated. 5. The mitral valve is normal in structure. Mild mitral valve regurgitation. No evidence of mitral stenosis. 6. The aortic valve is grossly normal. Aortic valve regurgitation is not visualized. No aortic stenosis is present. 7. Aortic dilatation noted. There is dilatation of the ascending aorta, measuring 39 mm. 8. The inferior vena cava is normal in size with greater than 50% respiratory variability, suggesting right atrial pressure of 3 mmHg.  EKG:    EKG Interpretation Date/Time:  Friday August 24 2023 13:40:16 EDT Ventricular Rate:  80 PR Interval:    QRS Duration:  98 QT Interval:  378 QTC Calculation: 435 R Axis:   29  Text Interpretation: EKG 08/24/2023: Atrial fibrillation with controlled ventricular response at the rate of 80 bpm, diffuse nonspecific ST-T abnormality, cannot exclude lateral ischemia.  Compared to 07/11/2022 and 05/18/2020, atrial fibrillation is new. Confirmed by Reni Hausner, Jagadeesh ([id number]) on 08/24/2023 5:52:00 PM    Medications ordered    Meds ordered this encounter  Medications   valsartan  (DIOVAN ) 160 MG tablet    Sig: Take 1 tablet (160 mg total) by mouth daily.    Dispense:  30 tablet    Refill:  1   metoprolol  succinate (TOPROL -XL) 50 MG 24 hr tablet    Sig: Take 1 tablet (50 mg total) by mouth every evening.    Dispense:  90 tablet     Refill:  1   rivaroxaban (XARELTO) 20 MG TABS tablet    Sig: Take 1 tablet (20 mg total) by mouth daily with supper.    Dispense:  30 tablet    Refill:  2     ASSESSMENT AND PLAN: .      ICD-10-CM   1. Persistent atrial fibrillation (HCC)  I48.19     2. Non-ischemic cardiomyopathy (HCC)  I42.8 valsartan  (DIOVAN ) 160 MG tablet    metoprolol  succinate (TOPROL -XL) 50 MG 24 hr tablet    AMB Referral to [facility] Pharm[facility]    3. Aortic root dilation (HCC)  I77.810 EKG 12-Lead    valsartan  (DIOVAN ) 160 MG tablet    4. Essential hypertension  I10     5. Tobacco use disorder  F17.200      Assessment & Plan  Persistent atrial fibrillation I reviewed  the echocardiogram that was done on 07/03/2023, patient clearly was in atrial fibrillation at that time. Patient's new onset of cardiomyopathy is related to atrial fibrillation.  He needs rhythm control. Will start him on Xarelto 20 mg in the evening and consider cardioversion in 3 to 4 weeks. Discussed the effects of NSAIDs on Xarelto and bleeding risk.  Will change his meloxicam to as needed use. Discussed the importance of use of BiPAP for his severe sleep apnea and suspect the noncompliance with BiPAP has led to atrial fibrillation.  Nonischemic cardiomyopathy with mildly reduced ejection fraction Mild reduction in heart function with ejection fraction at 40-45%. Right ventricle mildly dilated with reduced function, likely due to smoking. No coronary artery blockages as per previous cardiac catheterization. Echocardiogram on 07/03/2023 showed reduced heart function. Smoking cessation is crucial to prevent further deterioration. - Stop lisinopril  - Stop amlodipine  - Increase metoprolol  succinate to 50 mg daily - Start valsartan  160 mg daily - Will consider Entresto by starting initial dose at 49/51 mg dose twice daily in 3-4 weeks after washout of lisinopril  - Repeat echocardiogram in 6 months  Hypertension Blood pressure management  requires adjustment due to changes in heart function and medication regimen. Current regimen includes metoprolol  and lisinopril  which will be switched over to valsartan  today to manage blood pressure and support heart function.  Obstructive sleep apnea Severe sleep apnea with recent non-compliance with CPAP due to nasal congestion. Fatigue likely exacerbated by inconsistent CPAP use. Nasal decongestants and topical treatments recommended to improve compliance. - Encourage regular use of CPAP - Use decongestants like Allegra D or Afrin for nasal congestion - Apply Vaseline around the nose to prevent irritation  Tobacco use disorder Continued smoking with reduction to less than half a pack per day. Smoking contributing to reduced heart function. Strong recommendation for cessation to improve cardiac health. - Strongly advise cessation of smoking  Peripheral edema Intermittent swelling in feet and sometimes up to knees, possibly related to heart function.  Aortic root dilatation Mild aortic root dilatation noted, not of significant concern at this time.  Probably 38-39 mm aortic root is normal for his height and weight. - No further evaluation needed at this time  I called his home, spoke to his wife and explained regarding atrial fibrillation and cardiomyopathy.  Patient will start with Xarelto tonight.  Signed,  Gordy Bergamo, MD, [facility] 08/24/2023, 6:07 PM [facility] [address] [place], [place] [address] Phone: [phone]. Fax:  [phone]

## 2023-08-24 NOTE — Addendum Note (Signed)
 Addended by: LADONA MILAN on: 08/24/2023 06:08 PM   Modules accepted: Orders

## 2023-08-24 NOTE — Patient Instructions (Addendum)
 Medication Instructions:  Your physician has recommended you make the following change in your medication:  Stop lisinopril  Stop Amlodipine  Start Valsartan  160 mg by mouth daily  Increase Metoprolol  Succinate to 50 mg by mouth daily   *If you need a refill on your cardiac medications before your next appointment, please call your pharmacy*  Lab Work: none If you have labs (blood work) drawn today and your tests are completely normal, you will receive your results only by: MyChart Message (if you have MyChart) OR A paper copy in the mail If you have any lab test that is abnormal or we need to change your treatment, we will call you to review the results.  Testing/Procedures: Your physician has requested that you have an echocardiogram in January prior to appointment with Dr Ladona. Echocardiography is a painless test that uses sound waves to create images of your heart. It provides your doctor with information about the size and shape of your heart and how well your heart's chambers and valves are working. This procedure takes approximately one hour. There are no restrictions for this procedure. Please do NOT wear cologne, perfume, aftershave, or lotions (deodorant is allowed). Please arrive 15 minutes prior to your appointment time.  Please note: We ask at that you not bring children with you during ultrasound (echo/ vascular) testing. Due to room size and safety concerns, children are not allowed in the ultrasound rooms during exams. Our front office staff cannot provide observation of children in our lobby area while testing is being conducted. An adult accompanying a patient to their appointment will only be allowed in the ultrasound room at the discretion of the ultrasound technician under special circumstances. We apologize for any inconvenience.   Follow-Up: At [facility], you and your health needs are our priority.  As part of our continuing mission to provide you with  exceptional heart care, our providers are all part of one team.  This team includes your primary Cardiologist (physician) and Advanced Practice Providers or APPs (Physician Assistants and Nurse Practitioners) who all work together to provide you with the care you need, when you need it.  Your next appointment:   6 month(s)  Provider:   Gordy Ladona, MD    We recommend signing up for the patient portal called MyChart.  Sign up information is provided on this After Visit Summary.  MyChart is used to connect with patients for Virtual Visits (Telemedicine).  Patients are able to view lab/test results, encounter notes, upcoming appointments, etc.  Non-urgent messages can be sent to your provider as well.   To learn more about what you can do with MyChart, go to [email or web address].   Other Instructions  You have been referred to see the pharmacist in our office.  Please schedule new patient appointment for 3-4 weeks from now

## 2023-08-26 ENCOUNTER — Telehealth: Payer: Self-pay | Admitting: Cardiology

## 2023-08-26 NOTE — Telephone Encounter (Signed)
 Telephone outpatient note  Patient ID: [name] [name] MRN: [id number]; DOB: [birth year] [facility] Providers Cardiologist:  Gordy Bergamo, MD        [name] [name] is a 64 y.o. male with COPD, OSA (on BIPAP), HTN, HFrEF (EF 40-45%).   Patient called into triage line. Called patient at 23.  Patient was changed from lisinopril  20mg  daily, amlodipine  2.5mg  qAfternoon, and metoprolol  succinate 25mg  at bedtime. He was changed to valsartan  160mg  daily and metoprolol  succinate 50mg  daily. After he got home, he got told he was atrial fibrillation and he was also started on rivaroxaban .  After he took rivaroxaban  he started feeling funny and he was noted his bp was 174/100, HR 94. Repeat bp noted to be 130 with HR down to 60. Reports mild dizziness when the blood pressure was high   Discussed that it may take time for the other medications to washout of his system and his new medications to take into full effect. Repeat BP at the time of the end of the call was 138/94. Will defer further uptitration, though can consider increase in next 1-2 days. Discussed if patient were to have worsening symptoms to call back or seek medical care (e.g., presenting to the ER).  Will forward message to Dr. Bergamo.    Current Outpatient Medications  Medication Instructions   atorvastatin  (LIPITOR) 40 mg, Oral, Daily   budesonide-formoterol (SYMBICORT) 160-4.5 MCG/ACT inhaler 2 puffs, 2 times daily   busPIRone (BUSPAR) 10 mg, 3 times daily   gabapentin  (NEURONTIN ) 300 mg, As directed   meloxicam (MOBIC) 15 mg, Oral, Daily PRN   metoprolol  succinate (TOPROL -XL) 50 mg, Oral, Every evening   Multiple Vitamin (MULTIVITAMIN) capsule 1 capsule, Daily   omeprazole (PRILOSEC) 40 mg, Daily   oxyCODONE -acetaminophen  (PERCOCET/ROXICET) 5-325 MG tablet 1 tablet, Every 12 hours   rivaroxaban  (XARELTO ) 20 mg, Oral, Daily with supper   sodium chloride  (OCEAN) 0.65 % SOLN nasal spray 1 spray, As needed    tiZANidine  (ZANAFLEX ) 4 mg, 3 times daily   valsartan  (DIOVAN ) 160 mg, Oral, Daily   Allergies  Allergen Reactions   Hydrocodone Other (See Comments)    hallucinations     For questions or updates, please contact [facility] Please consult [email or web address] for contact info under    [name], MD  08/26/2023 6:08 PM

## 2023-08-27 ENCOUNTER — Telehealth: Payer: Self-pay | Admitting: *Deleted

## 2023-08-27 NOTE — Telephone Encounter (Signed)
 Per Dr Ladona patient needs appointment with APP in about 3 weeks.  Will not need pharmacy appointment as he will be seeing APP.  Patient's wife notified.  Appointment made with LOIS Louder, PA for August 25 at 8 AM

## 2023-08-28 NOTE — Telephone Encounter (Signed)
 Thank you. Totally agree, all good will just have to wait for a few days. He is safe

## 2023-08-31 DIAGNOSIS — M47812 Spondylosis without myelopathy or radiculopathy, cervical region: Secondary | ICD-10-CM | POA: Diagnosis not present

## 2023-08-31 DIAGNOSIS — M25552 Pain in left hip: Secondary | ICD-10-CM | POA: Diagnosis not present

## 2023-08-31 DIAGNOSIS — M47816 Spondylosis without myelopathy or radiculopathy, lumbar region: Secondary | ICD-10-CM | POA: Diagnosis not present

## 2023-08-31 DIAGNOSIS — M25551 Pain in right hip: Secondary | ICD-10-CM | POA: Diagnosis not present

## 2023-08-31 DIAGNOSIS — F1120 Opioid dependence, uncomplicated: Secondary | ICD-10-CM | POA: Diagnosis not present

## 2023-09-03 NOTE — Progress Notes (Signed)
 Cardiology Office Note   Date:  09/17/2023  ID:  Octavian, Godek 1960-05-14, MRN [id number] PCP: Maree Isles, MD  [facility] Providers Cardiologist:  Gordy Bergamo, MD  History of Present Illness DERRY KASSEL is a 63 y.o. male with a past medical history of  Nonischemic cardiomyopathy Cardiac catheterization 04/2020 showed no LV systolic function, normal LVEDP, no significant coronary artery disease Echocardiogram 07/03/2023 showed EF 40-45%, mild LVH, grade 1 DD, mildly reduced RV systolic function, normal PA systolic pressure, mild MR Persistent atrial fibrillation Patient was in atrial fibrillation at the time of echo on 07/03/23. Afib confirmed at office appointment 08/24/23 and patient was started on xarelto . 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  He reports that he has been feeling somewhat poorly.  He has noticed increased fatigue, worsening lower extremity swelling and headaches.  He denies palpitation.  Notes that he has had lower extremity swelling for 1+ years but it has been more noticeable recently.  He also has some chronic shortness of breath but has been having worsening dyspnea on exertion and orthopnea.  Has also had a bit more of a cough.  He has been having chest pain intermittently for years.  He has continued to have episodes of chest pain which have been stable over time.  Chest pain feels like pressure.  Occurs randomly and often occurs when he is at rest.  He reports that this has been an ongoing issue and has not worsened since he had his cardiac catheterization in 04/2020.  We thoroughly  discussed atrial fibrillation and management options.  He has been on Xarelto  for over 3 weeks and has not missed any doses.  He is tolerating Xarelto  well and denies bleeding.  We discussed cardioversion which patient is willing to proceed with.  Patient also was diagnosed with heart failure with EF 40-45% in 06/2023.  We discussed heart failure and GDMT.  Transition from valsartan  to Entresto .  Also started Lasix  for lower extremity swelling.  Patient reports that he has been compliant with his CPAP.  He has been working to cut back on his tobacco use.   Studies Reviewed Cardiac Studies & Procedures   ______________________________________________________________________________________________ CARDIAC CATHETERIZATION  CARDIAC CATHETERIZATION 05/18/2020  Conclusion Cardiogram 05/18/2020: Normal LV systolic function, normal LVEDP. LM: Large-caliber vessel, smooth and normal. LAD: Very large caliber vessel giving origin to a very large D1 and a large D2.  Minimal disease is evident. CX: Moderate to large caliber vessel.  Normal. RCA: Large-caliber vessel, smooth and normal.  Recommendation: Patient does not have significant coronary disease.  His chest pain could be noncardiac.  Dyspnea is probably related to underlying COPD.  Findings Coronary Findings Diagnostic  Dominance: Right  Left Anterior Descending The vessel exhibits minimal luminal irregularities.  First Diagonal Branch The vessel exhibits minimal luminal irregularities.  Left Circumflex Vessel is angiographically normal.  Right Coronary Artery Vessel is angiographically normal.  Intervention  No interventions have been documented.   STRESS TESTS  PCV MYOCARDIAL PERFUSION WITH LEXISCAN  04/14/2020  Narrative Lexiscan  Tetrofosmin Stress Test  04/14/2020: Nondiagnostic ECG stress. Moderate degree medium extent perfusion defect consistent with mild (reversible) ischemia located in the apical lateral wall  and mid  inferolateral wall (Left Circumflex Artery region) of left ventricle. Overall LV systolic function is normal with regional wall motion abnormalities. Stress LV EF: 44%. No previous exam available for comparison. Intermediate risk study.   ECHOCARDIOGRAM  ECHOCARDIOGRAM COMPLETE 07/03/2023  Narrative ECHOCARDIOGRAM REPORT    Patient Name:   CALLAHAN WILD Date of Exam: 07/03/2023 Medical Rec #:  [id number]         Height:       69.0 in Accession #:    [id number]        Weight:       205.6 lb Date of Birth:  [birth year]         BSA:          2.090 m Patient Age:    62 years          BP:           113/73 mmHg Patient Gender: M                 HR:           96 bpm. Exam Location:  [address]  Procedure: 2D Echo, 3D Echo, Cardiac Doppler and Color Doppler (Both Spectral and Color Flow Doppler were utilized during procedure).  Indications:    I77.810 Dilated Aortic Root  History:        Patient has prior history of Echocardiogram examinations, most recent 07/03/2022. COPD, Signs/Symptoms:Dyspnea, Shortness of Breath, Edema and Fatigue; Risk Factors:Family History of Coronary Artery Disease, Hypertension, Sleep Apnea and Current Smoker. GERD, Dilated Aortic Root.  Sonographer:    Heather Hawks RDCS Referring Phys: GORDY BERGAMO  IMPRESSIONS   1. Left ventricular ejection fraction, by estimation, is 40 to 45%. The left ventricle has mildly decreased function. The left ventricle demonstrates global hypokinesis. The left ventricular internal cavity size was upper limit of normal. There is mild left ventricular hypertrophy. Left ventricular diastolic parameters are consistent with Grade I diastolic dysfunction (impaired relaxation). 2. Right ventricular systolic function is mildly reduced. The right ventricular size is normal. There is normal pulmonary artery systolic pressure. 3. Left atrial size was moderately dilated. 4. Right atrial size was mildly dilated. 5. The mitral valve is  normal in structure. Mild mitral valve regurgitation. No evidence of mitral stenosis. 6. The aortic valve is grossly normal. Aortic valve regurgitation is not visualized. No aortic stenosis is present. 7. Aortic dilatation noted. There is dilatation of the ascending aorta, measuring 39 mm. 8. The inferior vena cava is normal in size with greater than 50% respiratory variability, suggesting right atrial pressure of 3 mmHg.  Comparison(s): No prior Echocardiogram.  FINDINGS Left Ventricle: Left ventricular ejection fraction, by estimation, is 40 to 45%. The left ventricle has mildly decreased function. The left ventricle demonstrates global hypokinesis. 3D ejection fraction reviewed and evaluated as part of the interpretation. Alternate measurement of EF is felt to be most reflective of LV function. The left ventricular internal cavity size was upper limit of normal. There is mild left ventricular hypertrophy. Left ventricular diastolic parameters are consistent with Grade I diastolic dysfunction (impaired relaxation).  Right Ventricle: The right ventricular size is normal. No increase in right ventricular wall thickness. Right ventricular systolic function is mildly reduced. There is normal pulmonary artery systolic pressure. The tricuspid regurgitant velocity is 2.69 m/s, and with an assumed right atrial pressure of 3 mmHg, the estimated right ventricular systolic pressure is 31.9 mmHg.  Left Atrium: Left atrial size was moderately dilated.  Right  Atrium: Right atrial size was mildly dilated.  Pericardium: There is no evidence of pericardial effusion.  Mitral Valve: The mitral valve is normal in structure. Mild mitral valve regurgitation. No evidence of mitral valve stenosis.  Tricuspid Valve: The tricuspid valve is grossly normal. Tricuspid valve regurgitation is mild . No evidence of tricuspid stenosis.  Aortic Valve: The aortic valve is grossly normal. Aortic valve regurgitation is not  visualized. No aortic stenosis is present.  Pulmonic Valve: The pulmonic valve was not well visualized. Pulmonic valve regurgitation is not visualized. No evidence of pulmonic stenosis.  Aorta: The aortic root is normal in size and structure and aortic dilatation noted. There is dilatation of the ascending aorta, measuring 39 mm.  Venous: The inferior vena cava is normal in size with greater than 50% respiratory variability, suggesting right atrial pressure of 3 mmHg.  IAS/Shunts: The interatrial septum appears to be lipomatous. No atrial level shunt detected by color flow Doppler.   LEFT VENTRICLE PLAX 2D LVIDd:         4.90 cm   Diastology LVIDs:         4.10 cm   LV e' medial:    7.40 cm/s LV PW:         1.15 cm   LV E/e' medial:  10.3 LV IVS:        1.20 cm   LV e' lateral:   13.50 cm/s LVOT diam:     2.80 cm   LV E/e' lateral: 5.6 LV SV:         85 LV SV Index:   41 LVOT Area:     6.16 cm  3D Volume EF: 3D EF:        38 % LV EDV:       152 ml LV ESV:       94 ml LV SV:        57 ml  RIGHT VENTRICLE RV Basal diam:  4.00 cm RV Mid diam:    3.00 cm RV S prime:     8.24 cm/s TAPSE (M-mode): 1.5 cm RVSP:           31.9 mmHg  LEFT ATRIUM              Index        RIGHT ATRIUM           Index LA diam:        4.85 cm  2.32 cm/m   RA Pressure: 3.00 mmHg LA Vol (A2C):   71.1 ml  34.01 ml/m  RA Area:     23.50 cm LA Vol (A4C):   102.0 ml 48.79 ml/m  RA Volume:   78.30 ml  37.46 ml/m LA Biplane Vol: 88.6 ml  42.38 ml/m AORTIC VALVE LVOT Vmax:   69.35 cm/s LVOT Vmean:  44.275 cm/s LVOT VTI:    0.138 m  AORTA Ao Root diam: 3.60 cm Ao Asc diam:  3.95 cm  MITRAL VALVE               TRICUSPID VALVE MV Area (PHT): cm         TR Peak grad:   28.9 mmHg MV Decel Time: 150 msec    TR Vmax:        269.00 cm/s MV E velocity: 76.10 cm/s  Estimated RAP:  3.00 mmHg MV A velocity: 45.00 cm/s  RVSP:           31.9 mmHg MV E/A ratio:  1.69  SHUNTS Systemic VTI:  0.14 m Systemic  Diam: 2.80 cm  Sunit Tolia Electronically signed by Madonna Large Signature Date/Time: 07/03/2023/4:30:42 PM    Final          ______________________________________________________________________________________________       Risk Assessment/Calculations  CHA2DS2-VASc Score = 2  This indicates a 2.2% annual risk of stroke. The patient's score is based upon: CHF History: 1 HTN History: 1 Diabetes History: 0 Stroke History: 0 Vascular Disease History: 0 Age Score: 0 Gender Score: 0        Physical Exam VS:  BP (!) 152/86   Pulse 78   Ht 5' 9 (1.753 m)   Wt 218 lb (98.9 kg)   SpO2 97%   BMI 32.19 kg/m        Wt Readings from Last 3 Encounters:  09/17/23 218 lb (98.9 kg)  08/24/23 214 lb 9.6 oz (97.3 kg)  07/11/22 205 lb 9.6 oz (93.3 kg)    GEN: Well nourished, well developed in no acute distress. Sitting comfortably on the exam table  NECK: No JVD; No carotid bruits CARDIAC: Irregular rate and rhtyhm, no murmurs, rubs, gallops RESPIRATORY: Inspiratory and expiratory wheezing throughout.  Fine crackles in bilateral lung bases.  Normal work of breathing on room air ABDOMEN: Soft, non-tender, non-distended EXTREMITIES: 1+ edema in bilateral lower extremities  ASSESSMENT AND PLAN  Persistent atrial fibrillation - Patient was in A-fib at time of echo in 06/2023.  A-fib confirmed at office appointment 08/24/2023 and he was started on Xarelto  - EKG today shows atrial fibrillation with heart rate 76 bpm -Patient occasionally has palpitations.  He brought a BP and heart rate log with him and his heart rate has overall been very well-controlled  -He has been compliant with his blood thinner and has not missed any doses.  Discussed direct-current cardioversion which patient is agreeable to.  Will arrange for next week -Ordered BMP, CBC, TSH, free T4, mag - Continue Xarelto  20 mg daily- denies bleeding - Continue metoprolol  succinate 50 mg daily  Nonischemic  cardiomyopathy - Echocardiogram 07/03/2023 showed EF 40-45%, mild LVH, grade 1 DD, mildly reduced RV systolic function, mild MR - Patient previously had cath in 04/2020 that showed no significant CAD.  Suspected that cardiomyopathy related to new onset A-fib -Patient reports that he has been having more significant lower extremity swelling than usual.  He has also been more short of breath than usual.  On exam today he has fine crackles in bilateral lung bases and 1+ edema in bilateral lower extremities - Start Lasix  20 mg daily - Currently on valsartan  160 mg daily - transition to Entresto  24/26 mg BID  - Continue metoprolol  succinate 50 mg daily - Ordered BMP -Scheduled cardioversion as above.  Repeat echo in 6 months after restoration of normal sinus rhythm and GDMT  Hypertension - Patient brought BP log with him to clinic today.  His systolic BP has been in the 140s-150s at home - Transition from valsartan  to Entresto  24-26 mg twice daily - Start Lasix  20 mg daily - Continue metoprolol  succinate 50 mg daily - Instructed patient to continue to monitor heart rate and BP at home.  OSA - Patient reports excellent compliance with CPAP  Aortic root dilation - Echocardiogram 06/2023 noted dilation of the ascending aorta measuring 39 mm.  Of note, this is likely normal for height and weight.  No further evaluation needed at this time  Chest pain  - Patient reports that he has been having episodes  of chest pain for years.  Chest pain occurs randomly, feels like a pressure in his chest.  Often occurs when he is at rest - Patient previously had cardiac catheterization 04/2020 that did not show significant coronary disease.  Reports that his symptoms have been stable since before cath - Discussed possible stress test but through joint decision-making we decided to hold off for now.  Overall, chest pain is atypical as it occurs when he was at rest.  Reassuring that symptoms have been stable for many  years and he previously had clean cath.  Informed Consent   Shared Decision Making/Informed Consent{ The risks (stroke, cardiac arrhythmias rarely resulting in the need for a temporary or permanent pacemaker, skin irritation or burns and complications associated with conscious sedation including aspiration, arrhythmia, respiratory failure and death), benefits (restoration of normal sinus rhythm) and alternatives of a direct current cardioversion were explained in detail to Mr. Kicklighter and he agrees to proceed.       Dispo: Follow up in 2 weeks   Signed, Rollo FABIENE Louder, PA-C

## 2023-09-03 NOTE — H&P (View-Only) (Signed)
 Cardiology Office Note   Date:  09/17/2023  ID:  Octavian, Godek 1960-05-14, MRN [id number] PCP: Maree Isles, MD  [facility] Providers Cardiologist:  Gordy Bergamo, MD  History of Present Illness DERRY KASSEL is a 63 y.o. male with a past medical history of  Nonischemic cardiomyopathy Cardiac catheterization 04/2020 showed no LV systolic function, normal LVEDP, no significant coronary artery disease Echocardiogram 07/03/2023 showed EF 40-45%, mild LVH, grade 1 DD, mildly reduced RV systolic function, normal PA systolic pressure, mild MR Persistent atrial fibrillation Patient was in atrial fibrillation at the time of echo on 07/03/23. Afib confirmed at office appointment 08/24/23 and patient was started on xarelto . 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  He reports that he has been feeling somewhat poorly.  He has noticed increased fatigue, worsening lower extremity swelling and headaches.  He denies palpitation.  Notes that he has had lower extremity swelling for 1+ years but it has been more noticeable recently.  He also has some chronic shortness of breath but has been having worsening dyspnea on exertion and orthopnea.  Has also had a bit more of a cough.  He has been having chest pain intermittently for years.  He has continued to have episodes of chest pain which have been stable over time.  Chest pain feels like pressure.  Occurs randomly and often occurs when he is at rest.  He reports that this has been an ongoing issue and has not worsened since he had his cardiac catheterization in 04/2020.  We thoroughly  discussed atrial fibrillation and management options.  He has been on Xarelto  for over 3 weeks and has not missed any doses.  He is tolerating Xarelto  well and denies bleeding.  We discussed cardioversion which patient is willing to proceed with.  Patient also was diagnosed with heart failure with EF 40-45% in 06/2023.  We discussed heart failure and GDMT.  Transition from valsartan  to Entresto .  Also started Lasix  for lower extremity swelling.  Patient reports that he has been compliant with his CPAP.  He has been working to cut back on his tobacco use.   Studies Reviewed Cardiac Studies & Procedures   ______________________________________________________________________________________________ CARDIAC CATHETERIZATION  CARDIAC CATHETERIZATION 05/18/2020  Conclusion Cardiogram 05/18/2020: Normal LV systolic function, normal LVEDP. LM: Large-caliber vessel, smooth and normal. LAD: Very large caliber vessel giving origin to a very large D1 and a large D2.  Minimal disease is evident. CX: Moderate to large caliber vessel.  Normal. RCA: Large-caliber vessel, smooth and normal.  Recommendation: Patient does not have significant coronary disease.  His chest pain could be noncardiac.  Dyspnea is probably related to underlying COPD.  Findings Coronary Findings Diagnostic  Dominance: Right  Left Anterior Descending The vessel exhibits minimal luminal irregularities.  First Diagonal Branch The vessel exhibits minimal luminal irregularities.  Left Circumflex Vessel is angiographically normal.  Right Coronary Artery Vessel is angiographically normal.  Intervention  No interventions have been documented.   STRESS TESTS  PCV MYOCARDIAL PERFUSION WITH LEXISCAN  04/14/2020  Narrative Lexiscan  Tetrofosmin Stress Test  04/14/2020: Nondiagnostic ECG stress. Moderate degree medium extent perfusion defect consistent with mild (reversible) ischemia located in the apical lateral wall  and mid  inferolateral wall (Left Circumflex Artery region) of left ventricle. Overall LV systolic function is normal with regional wall motion abnormalities. Stress LV EF: 44%. No previous exam available for comparison. Intermediate risk study.   ECHOCARDIOGRAM  ECHOCARDIOGRAM COMPLETE 07/03/2023  Narrative ECHOCARDIOGRAM REPORT    Patient Name:   CALLAHAN WILD Date of Exam: 07/03/2023 Medical Rec #:  [id number]         Height:       69.0 in Accession #:    [id number]        Weight:       205.6 lb Date of Birth:  [birth year]         BSA:          2.090 m Patient Age:    62 years          BP:           113/73 mmHg Patient Gender: M                 HR:           96 bpm. Exam Location:  [address]  Procedure: 2D Echo, 3D Echo, Cardiac Doppler and Color Doppler (Both Spectral and Color Flow Doppler were utilized during procedure).  Indications:    I77.810 Dilated Aortic Root  History:        Patient has prior history of Echocardiogram examinations, most recent 07/03/2022. COPD, Signs/Symptoms:Dyspnea, Shortness of Breath, Edema and Fatigue; Risk Factors:Family History of Coronary Artery Disease, Hypertension, Sleep Apnea and Current Smoker. GERD, Dilated Aortic Root.  Sonographer:    Heather Hawks RDCS Referring Phys: GORDY BERGAMO  IMPRESSIONS   1. Left ventricular ejection fraction, by estimation, is 40 to 45%. The left ventricle has mildly decreased function. The left ventricle demonstrates global hypokinesis. The left ventricular internal cavity size was upper limit of normal. There is mild left ventricular hypertrophy. Left ventricular diastolic parameters are consistent with Grade I diastolic dysfunction (impaired relaxation). 2. Right ventricular systolic function is mildly reduced. The right ventricular size is normal. There is normal pulmonary artery systolic pressure. 3. Left atrial size was moderately dilated. 4. Right atrial size was mildly dilated. 5. The mitral valve is  normal in structure. Mild mitral valve regurgitation. No evidence of mitral stenosis. 6. The aortic valve is grossly normal. Aortic valve regurgitation is not visualized. No aortic stenosis is present. 7. Aortic dilatation noted. There is dilatation of the ascending aorta, measuring 39 mm. 8. The inferior vena cava is normal in size with greater than 50% respiratory variability, suggesting right atrial pressure of 3 mmHg.  Comparison(s): No prior Echocardiogram.  FINDINGS Left Ventricle: Left ventricular ejection fraction, by estimation, is 40 to 45%. The left ventricle has mildly decreased function. The left ventricle demonstrates global hypokinesis. 3D ejection fraction reviewed and evaluated as part of the interpretation. Alternate measurement of EF is felt to be most reflective of LV function. The left ventricular internal cavity size was upper limit of normal. There is mild left ventricular hypertrophy. Left ventricular diastolic parameters are consistent with Grade I diastolic dysfunction (impaired relaxation).  Right Ventricle: The right ventricular size is normal. No increase in right ventricular wall thickness. Right ventricular systolic function is mildly reduced. There is normal pulmonary artery systolic pressure. The tricuspid regurgitant velocity is 2.69 m/s, and with an assumed right atrial pressure of 3 mmHg, the estimated right ventricular systolic pressure is 31.9 mmHg.  Left Atrium: Left atrial size was moderately dilated.  Right  Atrium: Right atrial size was mildly dilated.  Pericardium: There is no evidence of pericardial effusion.  Mitral Valve: The mitral valve is normal in structure. Mild mitral valve regurgitation. No evidence of mitral valve stenosis.  Tricuspid Valve: The tricuspid valve is grossly normal. Tricuspid valve regurgitation is mild . No evidence of tricuspid stenosis.  Aortic Valve: The aortic valve is grossly normal. Aortic valve regurgitation is not  visualized. No aortic stenosis is present.  Pulmonic Valve: The pulmonic valve was not well visualized. Pulmonic valve regurgitation is not visualized. No evidence of pulmonic stenosis.  Aorta: The aortic root is normal in size and structure and aortic dilatation noted. There is dilatation of the ascending aorta, measuring 39 mm.  Venous: The inferior vena cava is normal in size with greater than 50% respiratory variability, suggesting right atrial pressure of 3 mmHg.  IAS/Shunts: The interatrial septum appears to be lipomatous. No atrial level shunt detected by color flow Doppler.   LEFT VENTRICLE PLAX 2D LVIDd:         4.90 cm   Diastology LVIDs:         4.10 cm   LV e' medial:    7.40 cm/s LV PW:         1.15 cm   LV E/e' medial:  10.3 LV IVS:        1.20 cm   LV e' lateral:   13.50 cm/s LVOT diam:     2.80 cm   LV E/e' lateral: 5.6 LV SV:         85 LV SV Index:   41 LVOT Area:     6.16 cm  3D Volume EF: 3D EF:        38 % LV EDV:       152 ml LV ESV:       94 ml LV SV:        57 ml  RIGHT VENTRICLE RV Basal diam:  4.00 cm RV Mid diam:    3.00 cm RV S prime:     8.24 cm/s TAPSE (M-mode): 1.5 cm RVSP:           31.9 mmHg  LEFT ATRIUM              Index        RIGHT ATRIUM           Index LA diam:        4.85 cm  2.32 cm/m   RA Pressure: 3.00 mmHg LA Vol (A2C):   71.1 ml  34.01 ml/m  RA Area:     23.50 cm LA Vol (A4C):   102.0 ml 48.79 ml/m  RA Volume:   78.30 ml  37.46 ml/m LA Biplane Vol: 88.6 ml  42.38 ml/m AORTIC VALVE LVOT Vmax:   69.35 cm/s LVOT Vmean:  44.275 cm/s LVOT VTI:    0.138 m  AORTA Ao Root diam: 3.60 cm Ao Asc diam:  3.95 cm  MITRAL VALVE               TRICUSPID VALVE MV Area (PHT): cm         TR Peak grad:   28.9 mmHg MV Decel Time: 150 msec    TR Vmax:        269.00 cm/s MV E velocity: 76.10 cm/s  Estimated RAP:  3.00 mmHg MV A velocity: 45.00 cm/s  RVSP:           31.9 mmHg MV E/A ratio:  1.69  SHUNTS Systemic VTI:  0.14 m Systemic  Diam: 2.80 cm  Sunit Tolia Electronically signed by Madonna Large Signature Date/Time: 07/03/2023/4:30:42 PM    Final          ______________________________________________________________________________________________       Risk Assessment/Calculations  CHA2DS2-VASc Score = 2  This indicates a 2.2% annual risk of stroke. The patient's score is based upon: CHF History: 1 HTN History: 1 Diabetes History: 0 Stroke History: 0 Vascular Disease History: 0 Age Score: 0 Gender Score: 0        Physical Exam VS:  BP (!) 152/86   Pulse 78   Ht 5' 9 (1.753 m)   Wt 218 lb (98.9 kg)   SpO2 97%   BMI 32.19 kg/m        Wt Readings from Last 3 Encounters:  09/17/23 218 lb (98.9 kg)  08/24/23 214 lb 9.6 oz (97.3 kg)  07/11/22 205 lb 9.6 oz (93.3 kg)    GEN: Well nourished, well developed in no acute distress. Sitting comfortably on the exam table  NECK: No JVD; No carotid bruits CARDIAC: Irregular rate and rhtyhm, no murmurs, rubs, gallops RESPIRATORY: Inspiratory and expiratory wheezing throughout.  Fine crackles in bilateral lung bases.  Normal work of breathing on room air ABDOMEN: Soft, non-tender, non-distended EXTREMITIES: 1+ edema in bilateral lower extremities  ASSESSMENT AND PLAN  Persistent atrial fibrillation - Patient was in A-fib at time of echo in 06/2023.  A-fib confirmed at office appointment 08/24/2023 and he was started on Xarelto  - EKG today shows atrial fibrillation with heart rate 76 bpm -Patient occasionally has palpitations.  He brought a BP and heart rate log with him and his heart rate has overall been very well-controlled  -He has been compliant with his blood thinner and has not missed any doses.  Discussed direct-current cardioversion which patient is agreeable to.  Will arrange for next week -Ordered BMP, CBC, TSH, free T4, mag - Continue Xarelto  20 mg daily- denies bleeding - Continue metoprolol  succinate 50 mg daily  Nonischemic  cardiomyopathy - Echocardiogram 07/03/2023 showed EF 40-45%, mild LVH, grade 1 DD, mildly reduced RV systolic function, mild MR - Patient previously had cath in 04/2020 that showed no significant CAD.  Suspected that cardiomyopathy related to new onset A-fib -Patient reports that he has been having more significant lower extremity swelling than usual.  He has also been more short of breath than usual.  On exam today he has fine crackles in bilateral lung bases and 1+ edema in bilateral lower extremities - Start Lasix  20 mg daily - Currently on valsartan  160 mg daily - transition to Entresto  24/26 mg BID  - Continue metoprolol  succinate 50 mg daily - Ordered BMP -Scheduled cardioversion as above.  Repeat echo in 6 months after restoration of normal sinus rhythm and GDMT  Hypertension - Patient brought BP log with him to clinic today.  His systolic BP has been in the 140s-150s at home - Transition from valsartan  to Entresto  24-26 mg twice daily - Start Lasix  20 mg daily - Continue metoprolol  succinate 50 mg daily - Instructed patient to continue to monitor heart rate and BP at home.  OSA - Patient reports excellent compliance with CPAP  Aortic root dilation - Echocardiogram 06/2023 noted dilation of the ascending aorta measuring 39 mm.  Of note, this is likely normal for height and weight.  No further evaluation needed at this time  Chest pain  - Patient reports that he has been having episodes  of chest pain for years.  Chest pain occurs randomly, feels like a pressure in his chest.  Often occurs when he is at rest - Patient previously had cardiac catheterization 04/2020 that did not show significant coronary disease.  Reports that his symptoms have been stable since before cath - Discussed possible stress test but through joint decision-making we decided to hold off for now.  Overall, chest pain is atypical as it occurs when he was at rest.  Reassuring that symptoms have been stable for many  years and he previously had clean cath.  Informed Consent   Shared Decision Making/Informed Consent{ The risks (stroke, cardiac arrhythmias rarely resulting in the need for a temporary or permanent pacemaker, skin irritation or burns and complications associated with conscious sedation including aspiration, arrhythmia, respiratory failure and death), benefits (restoration of normal sinus rhythm) and alternatives of a direct current cardioversion were explained in detail to Mr. Kicklighter and he agrees to proceed.       Dispo: Follow up in 2 weeks   Signed, Rollo FABIENE Louder, PA-C

## 2023-09-17 ENCOUNTER — Encounter: Payer: Self-pay | Admitting: Cardiology

## 2023-09-17 ENCOUNTER — Ambulatory Visit: Attending: Cardiology | Admitting: Cardiology

## 2023-09-17 VITALS — BP 152/86 | HR 78 | Ht 69.0 in | Wt 218.0 lb

## 2023-09-17 DIAGNOSIS — I77810 Thoracic aortic ectasia: Secondary | ICD-10-CM

## 2023-09-17 DIAGNOSIS — G4733 Obstructive sleep apnea (adult) (pediatric): Secondary | ICD-10-CM

## 2023-09-17 DIAGNOSIS — I4819 Other persistent atrial fibrillation: Secondary | ICD-10-CM | POA: Diagnosis not present

## 2023-09-17 DIAGNOSIS — I10 Essential (primary) hypertension: Secondary | ICD-10-CM | POA: Diagnosis not present

## 2023-09-17 DIAGNOSIS — I428 Other cardiomyopathies: Secondary | ICD-10-CM

## 2023-09-17 LAB — CBC

## 2023-09-17 MED ORDER — FUROSEMIDE 20 MG PO TABS: 20.0000 mg | ORAL_TABLET | Freq: Every day | ORAL | 3 refills | Status: DC

## 2023-09-17 MED ORDER — SACUBITRIL-VALSARTAN 24-26 MG PO TABS: 1.0000 | ORAL_TABLET | Freq: Two times a day (BID) | ORAL | 3 refills | Status: AC

## 2023-09-17 NOTE — Patient Instructions (Signed)
 Medication Instructions:  Stop valsartan  Start lasix  20 mg once a day  Start Entresto  24-26 once a day  *If you need a refill on your cardiac medications before your next appointment, please call your pharmacy*  Lab Work: Today we are going to draw a Bmet, CBC, mag, TSH, and free T4 If you have labs (blood work) drawn today and your tests are completely normal, you will receive your results only by: MyChart Message (if you have MyChart) OR A paper copy in the mail If you have any lab test that is abnormal or we need to change your treatment, we will call you to review the results.  Testing/Procedures: No testing  Follow-Up: At [facility], you and your health needs are our priority.  As part of our continuing mission to provide you with exceptional heart care, our providers are all part of one team.  This team includes your primary Cardiologist (physician) and Advanced Practice Providers or APPs (Physician Assistants and Nurse Practitioners) who all work together to provide you with the care you need, when you need it.  Your next appointment:   2 week(s)  Provider:   Rollo Louder, PA-C     We recommend signing up for the patient portal called MyChart.  Sign up information is provided on this After Visit Summary.  MyChart is used to connect with patients for Virtual Visits (Telemedicine).  Patients are able to view lab/test results, encounter notes, upcoming appointments, etc.  Non-urgent messages can be sent to your provider as well.   To learn more about what you can do with MyChart, go to [email or web address].   Other Instructions    Dear [name] [name]  You are scheduled for a Cardioversion on Thursday, August 28 with Dr. Raford.  Please arrive at the [facility] (Main Entrance A) at [facility]: [address] [place], [place] [address] at 10:00 AM (This time is 2 hour(s) before your procedure to ensure your preparation).   Free valet parking  service is available. You will check in at ADMITTING.   *Please Note: You will receive a call the day before your procedure to confirm the appointment time. That time may have changed from the original time based on the schedule for that day.*  DIET:  Nothing to eat or drink after midnight except a sip of water with medications (see medication instructions below)  MEDICATION INSTRUCTIONS: Continue taking your anticoagulant (blood thinner): Rivaroxaban  (Xarelto ).  You will need to continue this after your procedure until you are told by your provider that it is safe to stop.    LABS: Come to the lab at the [facility] ([address], [place], 1st Floor) between the hours of 8:00 am and 4:30 pm. You do NOT have to be fasting.  FYI:  For your safety, and to allow us  to monitor your vital signs accurately during the surgery/procedure we request: If you have artificial nails, gel coating, SNS etc, please have those removed prior to your surgery/procedure. Not having the nail coverings /polish removed may result in cancellation or delay of your surgery/procedure.  Your support person will be asked to wait in the waiting room during your procedure.  It is OK to have someone drop you off and come back when you are ready to be discharged.  You cannot drive after the procedure and will need someone to drive you home.  Bring your insurance cards.  *Special Note: Every effort  is made to have your procedure done on time. Occasionally there are emergencies that occur at the hospital that may cause delays. Please be patient if a delay does occur.

## 2023-09-18 ENCOUNTER — Ambulatory Visit: Payer: Self-pay | Admitting: Cardiology

## 2023-09-18 LAB — CBC
Hematocrit: 38.3 (ref 37.5–51.0)
Hemoglobin: 12.9 g/dL — AB (ref 13.0–17.7)
MCH: 32.2 pg (ref 26.6–33.0)
MCHC: 33.7 g/dL (ref 31.5–35.7)
MCV: 96 fL (ref 79–97)
Platelets: 251 x10E3/uL (ref 150–450)
RBC: 4.01 x10E6/uL — AB (ref 4.14–5.80)
RDW: 12.4 (ref 11.6–15.4)
WBC: 7 x10E3/uL (ref 3.4–10.8)

## 2023-09-18 LAB — BASIC METABOLIC PANEL WITH GFR
BUN/Creatinine Ratio: 12 (ref 10–24)
BUN: 14 mg/dL (ref 8–27)
CO2: 25 mmol/L (ref 20–29)
Calcium: 9.2 mg/dL (ref 8.6–10.2)
Chloride: 106 mmol/L (ref 96–106)
Creatinine, Ser: 1.19 mg/dL (ref 0.76–1.27)
Glucose: 95 mg/dL (ref 70–99)
Potassium: 5 mmol/L (ref 3.5–5.2)
Sodium: 142 mmol/L (ref 134–144)
eGFR: 70 mL/min/1.73 (ref >59)

## 2023-09-18 LAB — TSH: TSH: 0.387 u[IU]/mL — AB (ref 0.450–4.500)

## 2023-09-18 LAB — MAGNESIUM: Magnesium: 2 mg/dL (ref 1.6–2.3)

## 2023-09-18 LAB — T4, FREE: Free T4: 1.2 ng/dL (ref 0.82–1.77)

## 2023-09-19 NOTE — Telephone Encounter (Signed)
 No voice mail.

## 2023-09-19 NOTE — Progress Notes (Signed)
 Spoke to patient and instructed them to come at 1000  and to be NPO after 0000.     Confirmed that patient will have a ride home and someone to stay with them for 24 hours after the procedure.   Medications reviewed.  Confirmed blood thinner.  Confirmed no breaks in taking blood thinner for 3+ weeks prior to procedure.

## 2023-09-19 NOTE — Telephone Encounter (Signed)
-----   Message from Rollo FABIENE Louder sent at 09/18/2023 10:33 AM EDT ----- Please tell patient that his lab work from yesterday showed slightly low hemoglobin at 12.9. His previous labs are about 61 years old, but it seems that his hemoglobin has been stable compared to  previous values. Normal WBC and platelets.   TSH is a bit low as well, but free T4 is within normal limits. This may indicate subclinical hyperthyroidism, but he should follow up with his PCP non-urgently to make sure it is being monitored.  With a normal free T4, there is no treatment needed.   Kidney function and electrolytes are within normal limits.   No changes to treatment plan based on labs.   Thanks KJ  ----- Message ----- From: Rebecka Memos Lab Results In Sent: 09/17/2023  11:35 PM EDT To: Rollo JONELLE Louder, PA-C

## 2023-09-20 ENCOUNTER — Encounter (HOSPITAL_COMMUNITY): Payer: Self-pay | Admitting: Cardiovascular Disease

## 2023-09-20 ENCOUNTER — Ambulatory Visit (HOSPITAL_COMMUNITY)
Admission: RE | Admit: 2023-09-20 | Discharge: 2023-09-20 | Disposition: A | Discharge status: Home / Self Care | Attending: Cardiovascular Disease | Admitting: Cardiovascular Disease

## 2023-09-20 ENCOUNTER — Ambulatory Visit (HOSPITAL_COMMUNITY): Admitting: Anesthesiology

## 2023-09-20 ENCOUNTER — Other Ambulatory Visit: Payer: Self-pay

## 2023-09-20 ENCOUNTER — Encounter (HOSPITAL_COMMUNITY)
Admission: RE | Disposition: A | Discharge status: Home / Self Care | Payer: Self-pay | Source: Home / Self Care | Attending: Cardiovascular Disease

## 2023-09-20 DIAGNOSIS — I110 Hypertensive heart disease with heart failure: Secondary | ICD-10-CM | POA: Insufficient documentation

## 2023-09-20 DIAGNOSIS — F418 Other specified anxiety disorders: Secondary | ICD-10-CM | POA: Diagnosis not present

## 2023-09-20 DIAGNOSIS — I428 Other cardiomyopathies: Secondary | ICD-10-CM | POA: Diagnosis not present

## 2023-09-20 DIAGNOSIS — I509 Heart failure, unspecified: Secondary | ICD-10-CM | POA: Insufficient documentation

## 2023-09-20 DIAGNOSIS — I4891 Unspecified atrial fibrillation: Secondary | ICD-10-CM

## 2023-09-20 DIAGNOSIS — J449 Chronic obstructive pulmonary disease, unspecified: Secondary | ICD-10-CM | POA: Insufficient documentation

## 2023-09-20 DIAGNOSIS — Z79899 Other long term (current) drug therapy: Secondary | ICD-10-CM | POA: Insufficient documentation

## 2023-09-20 DIAGNOSIS — I4819 Other persistent atrial fibrillation: Secondary | ICD-10-CM | POA: Diagnosis not present

## 2023-09-20 DIAGNOSIS — Z7901 Long term (current) use of anticoagulants: Secondary | ICD-10-CM | POA: Diagnosis not present

## 2023-09-20 DIAGNOSIS — I10 Essential (primary) hypertension: Secondary | ICD-10-CM | POA: Diagnosis not present

## 2023-09-20 DIAGNOSIS — G4733 Obstructive sleep apnea (adult) (pediatric): Secondary | ICD-10-CM | POA: Insufficient documentation

## 2023-09-20 HISTORY — PX: CARDIOVERSION: EP1203

## 2023-09-20 SURGERY — CARDIOVERSION (CATH LAB)
Anesthesia: General

## 2023-09-20 MED ORDER — SODIUM CHLORIDE 0.9% FLUSH
INTRAVENOUS | Status: DC | PRN
  Administered 2023-09-20: 3 mL via INTRAVENOUS

## 2023-09-20 MED ORDER — PROPOFOL 10 MG/ML IV BOLUS
INTRAVENOUS | Status: DC | PRN
  Administered 2023-09-20: 60 mg via INTRAVENOUS

## 2023-09-20 MED ORDER — LIDOCAINE 2% (20 MG/ML) 5 ML SYRINGE
INTRAMUSCULAR | Status: DC | PRN
  Administered 2023-09-20: 80 mg via INTRAVENOUS

## 2023-09-20 SURGICAL SUPPLY — 1 items: PAD DEFIB RADIO PHYSIO CONN (PAD) ×1 IMPLANT

## 2023-09-20 NOTE — Transfer of Care (Signed)
 Immediate Anesthesia Transfer of Care Note  Patient: [name] [name]  Procedure(s) Performed: CARDIOVERSION  Patient Location: Cath Lab  Anesthesia Type:General  Level of Consciousness: awake, alert , oriented, and patient cooperative  Airway & Oxygen Therapy: Patient Spontanous Breathing and Patient connected to face mask oxygen  Post-op Assessment: Report given to RN, Post -op Vital signs reviewed and stable, and Patient moving all extremities X 4  Post vital signs: Reviewed and stable  Last Vitals:  Vitals Value Taken Time  BP 141/81 09/20/23 11:01  Temp 36.6 C 09/20/23 11:01  Pulse 127 09/20/23 11:01  Resp 12 09/20/23 11:01  SpO2 100 % 09/20/23 11:01    Last Pain:  Vitals:   09/20/23 1101  TempSrc: Tympanic  PainSc: Asleep         Complications: No notable events documented.

## 2023-09-20 NOTE — CV Procedure (Signed)
 Electrical Cardioversion Procedure Note [name] [name] [id number] 1960/12/15  Procedure: Electrical Cardioversion Indications:  Atrial Fibrillation  Procedure Details Consent: Risks of procedure as well as the alternatives and risks of each were explained to the (patient/caregiver).  Consent for procedure obtained. Time Out: Verified patient identification, verified procedure, site/side was marked, verified correct patient position, special equipment/implants available, medications/allergies/relevent history reviewed, required imaging and test results available.  Performed  Patient placed on cardiac monitor, pulse oximetry, supplemental oxygen as necessary.  Sedation given: propofol  Pacer pads placed anterior and posterior chest.  Cardioverted 1 time(s).  Cardioverted at 200J.  Evaluation Findings: Post procedure EKG shows: sinus bradycardia Complications: None Patient did tolerate procedure well.   [name] Scarce, MD 09/20/2023, 11:01 AM

## 2023-09-20 NOTE — Anesthesia Postprocedure Evaluation (Signed)
 Anesthesia Post Note  Patient: [name] [name]  Procedure(s) Performed: CARDIOVERSION     Patient location during evaluation: Cath Lab Anesthesia Type: General Level of consciousness: awake and alert Pain management: pain level controlled Vital Signs Assessment: post-procedure vital signs reviewed and stable Respiratory status: spontaneous breathing, nonlabored ventilation and respiratory function stable Cardiovascular status: blood pressure returned to baseline and stable Postop Assessment: no apparent nausea or vomiting Anesthetic complications: no   No notable events documented.  Last Vitals:  Vitals:   09/20/23 1111 09/20/23 1121  BP: (!) 132/93 127/87  Pulse: 80 82  Resp: 18 18  Temp:    SpO2: 98% 95%    Last Pain:  Vitals:   09/20/23 1121  TempSrc:   PainSc: 0-No pain                 Garnette FORBES Skillern

## 2023-09-20 NOTE — Interval H&P Note (Signed)
 History and Physical Interval Note:  09/20/2023 10:40 AM  [name] [name]  has presented today for surgery, with the diagnosis of AFIB.  The various methods of treatment have been discussed with the patient and family. After consideration of risks, benefits and other options for treatment, the patient has consented to  Procedure(s): CARDIOVERSION (N/A) as a surgical intervention.  The patient's history has been reviewed, patient examined, no change in status, stable for surgery.  I have reviewed the patient's chart and labs.  Questions were answered to the patient's satisfaction.     Annabella Scarce, MD

## 2023-09-20 NOTE — Anesthesia Preprocedure Evaluation (Addendum)
 Anesthesia Evaluation  Patient identified by MRN, date of birth, ID band Patient awake    Reviewed: Allergy & Precautions, NPO status , Patient's Chart, lab work & pertinent test results, reviewed documented beta blocker date and time   Airway Mallampati: III  TM Distance: >3 FB Neck ROM: Full    Dental  (+) Dental Advisory Given, Upper Dentures   Pulmonary sleep apnea , COPD,  COPD inhaler, Current SmokerPatient did not abstain from smoking.   Pulmonary exam normal breath sounds clear to auscultation       Cardiovascular hypertension, Pt. on medications (-) angina (-) Past MI + dysrhythmias Atrial Fibrillation  Rhythm:Irregular Rate:Abnormal     Neuro/Psych  Headaches PSYCHIATRIC DISORDERS Anxiety Depression       GI/Hepatic Neg liver ROS,GERD  ,,  Endo/Other  negative endocrine ROS  Obesity   Renal/GU negative Renal ROS     Musculoskeletal  (+) Arthritis ,    Abdominal   Peds  Hematology  (+) Blood dyscrasia, anemia   Anesthesia Other Findings Day of surgery medications reviewed with the patient.  Reproductive/Obstetrics                              Anesthesia Physical Anesthesia Plan  ASA: 3  Anesthesia Plan: General   Post-op Pain Management: Minimal or no pain anticipated   Induction: Intravenous  PONV Risk Score and Plan: 1 and TIVA and Treatment may vary due to age or medical condition  Airway Management Planned: Simple Face Mask and Natural Airway  Additional Equipment:   Intra-op Plan:   Post-operative Plan:   Informed Consent: I have reviewed the patients History and Physical, chart, labs and discussed the procedure including the risks, benefits and alternatives for the proposed anesthesia with the patient or authorized representative who has indicated his/her understanding and acceptance.     Dental advisory given  Plan Discussed with: CRNA  Anesthesia Plan  Comments:          Anesthesia Quick Evaluation

## 2023-09-20 NOTE — Discharge Instructions (Signed)

## 2023-09-21 NOTE — Progress Notes (Signed)
 Cardiology Office Note   Date:  09/21/2023  ID:  Haze, Antillon [birth year], MRN [id number] PCP: Maree Isles, MD  [facility] Providers Cardiologist:  Gordy Bergamo, MD   History of Present Illness [name] [name] is a 63 y.o. male with a past medical history of   Nonischemic cardiomyopathy Cardiac catheterization 04/2020 showed no LV systolic function, normal LVEDP, no significant coronary artery disease Echocardiogram 07/03/2023 showed EF 40-45%, mild LVH, grade 1 DD, mildly reduced RV systolic function, normal PA systolic pressure, mild MR Persistent atrial fibrillation Patient was in atrial fibrillation at the time of echo on 07/03/23. Afib confirmed at office appointment 08/24/23 and patient was started on xarelto . 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 At that time, patient remained in atrial fibrillation. He reported fatigue, lower extremity swelling. No palpitations. HR was well controlled. He was set up for a cardioversion on 8/28   On interview, patient reports that he has been feeling poorly.  Reports that his main complaints have been joint pain.  We had stopped his meloxicam when he started Xarelto .  He does follow with pain management and is currently on a low-dose of Percocet.  He reports that he went for his cardioversion on 8/28 and was able to be converted to normal sinus rhythm after 1 shock.  After the procedure while in holding, he coughed a few times and went back into A-fib.  He denies palpitations. He wears an exercise watch that monitors his heart rate and he reports that his heart rate has been well-controlled.  He has some dyspnea on  exertion and orthopnea.  Continues to have lower extremity swelling as well.  He reports occasional twinges of chest pain.  These have been ongoing for years.  Have been stable since his cardiac catheterization in 2022.  His blood pressure has been well-controlled at home.  Studies Reviewed  Cardiac Studies & Procedures   ______________________________________________________________________________________________ CARDIAC CATHETERIZATION  CARDIAC CATHETERIZATION 05/18/2020  Conclusion Cardiogram 05/18/2020: Normal LV systolic function, normal LVEDP. LM: Large-caliber vessel, smooth and normal. LAD: Very large caliber vessel giving origin to a very large D1 and a large D2.  Minimal disease is evident. CX: Moderate to large caliber vessel.  Normal. RCA: Large-caliber vessel, smooth and normal.  Recommendation: Patient does not have significant coronary disease.  His chest pain could be noncardiac.  Dyspnea is probably related to underlying COPD.  Findings Coronary Findings Diagnostic  Dominance: Right  Left Anterior Descending The vessel exhibits minimal luminal irregularities.  First Diagonal Branch The vessel exhibits minimal luminal irregularities.  Left Circumflex Vessel is angiographically normal.  Right Coronary Artery Vessel is angiographically normal.  Intervention  No interventions have been documented.   STRESS TESTS  PCV MYOCARDIAL PERFUSION WITH LEXISCAN  04/14/2020  Narrative Lexiscan  Tetrofosmin Stress Test  04/14/2020: Nondiagnostic ECG stress. Moderate degree medium extent perfusion defect consistent with mild (reversible) ischemia located in the apical lateral wall and mid inferolateral wall (Left Circumflex Artery region) of left ventricle. Overall LV systolic function is normal with regional wall motion abnormalities. Stress LV EF: 44%. No previous exam available for comparison. Intermediate risk study.   ECHOCARDIOGRAM  ECHOCARDIOGRAM COMPLETE  07/03/2023  Narrative ECHOCARDIOGRAM REPORT  Patient Name:   [name] [name] Date of Exam: 07/03/2023 Medical Rec #:  [id number]         Height:       69.0 in Accession #:    [id number]        Weight:       205.6 lb Date of Birth:  October 11, 1960         BSA:          2.090 m Patient Age:    62 years          BP:           113/73 mmHg Patient Gender: M                 HR:           96 bpm. Exam Location:  [address]  Procedure: 2D Echo, 3D Echo, Cardiac Doppler and Color Doppler (Both Spectral and Color Flow Doppler were utilized during procedure).  Indications:    I77.810 Dilated Aortic Root  History:        Patient has prior history of Echocardiogram examinations, most recent 07/03/2022. COPD, Signs/Symptoms:Dyspnea, Shortness of Breath, Edema and Fatigue; Risk Factors:Family History of Coronary Artery Disease, Hypertension, Sleep Apnea and Current Smoker. GERD, Dilated Aortic Root.  Sonographer:    Heather Hawks RDCS Referring Phys: GORDY BERGAMO  IMPRESSIONS   1. Left ventricular ejection fraction, by estimation, is 40 to 45%. The left ventricle has mildly decreased function. The left ventricle demonstrates global hypokinesis. The left ventricular internal cavity size was upper limit of normal. There is mild left ventricular hypertrophy. Left ventricular diastolic parameters are consistent with Grade I diastolic dysfunction (impaired relaxation). 2. Right ventricular systolic function is mildly reduced. The right ventricular size is normal. There is normal pulmonary artery systolic pressure. 3. Left atrial size was moderately dilated. 4. Right atrial size was mildly dilated. 5. The mitral valve is normal in structure. Mild mitral valve regurgitation. No evidence of mitral stenosis. 6. The aortic valve is grossly normal. Aortic valve regurgitation is not visualized. No aortic stenosis is present. 7. Aortic dilatation noted. There is dilatation of the ascending aorta, measuring  39 mm. 8. The inferior vena cava is normal in size with greater than 50% respiratory variability, suggesting right atrial pressure of 3 mmHg.  Comparison(s): No prior Echocardiogram.  FINDINGS Left Ventricle: Left ventricular ejection fraction, by estimation, is 40 to 45%. The left ventricle has mildly decreased function. The left ventricle demonstrates global hypokinesis. 3D ejection fraction reviewed and evaluated as part of the interpretation. Alternate measurement of EF is felt to be most reflective of LV function. The left ventricular internal cavity size was upper limit of normal. There is mild left ventricular hypertrophy. Left ventricular diastolic parameters are consistent with Grade I diastolic dysfunction (impaired relaxation).  Right Ventricle: The right ventricular size is normal. No increase in right ventricular wall thickness. Right ventricular systolic function is mildly reduced. There is normal pulmonary artery systolic pressure. The tricuspid regurgitant velocity is 2.69 m/s, and with an assumed right atrial pressure of 3 mmHg, the estimated right ventricular systolic pressure is 31.9 mmHg.  Left Atrium: Left atrial size was moderately dilated.  Right Atrium: Right atrial size was mildly dilated.  Pericardium: There is no evidence of pericardial effusion.  Mitral Valve: The mitral valve is normal in structure. Mild mitral valve regurgitation. No evidence of mitral valve stenosis.  Tricuspid Valve: The tricuspid valve is grossly normal. Tricuspid valve regurgitation is mild .  No evidence of tricuspid stenosis.  Aortic Valve: The aortic valve is grossly normal. Aortic valve regurgitation is not visualized. No aortic stenosis is present.  Pulmonic Valve: The pulmonic valve was not well visualized. Pulmonic valve regurgitation is not visualized. No evidence of pulmonic stenosis.  Aorta: The aortic root is normal in size and structure and aortic dilatation noted. There is  dilatation of the ascending aorta, measuring 39 mm.  Venous: The inferior vena cava is normal in size with greater than 50% respiratory variability, suggesting right atrial pressure of 3 mmHg.  IAS/Shunts: The interatrial septum appears to be lipomatous. No atrial level shunt detected by color flow Doppler.   LEFT VENTRICLE PLAX 2D LVIDd:         4.90 cm   Diastology LVIDs:         4.10 cm   LV e' medial:    7.40 cm/s LV PW:         1.15 cm   LV E/e' medial:  10.3 LV IVS:        1.20 cm   LV e' lateral:   13.50 cm/s LVOT diam:     2.80 cm   LV E/e' lateral: 5.6 LV SV:         85 LV SV Index:   41 LVOT Area:     6.16 cm  3D Volume EF: 3D EF:        38 % LV EDV:       152 ml LV ESV:       94 ml LV SV:        57 ml  RIGHT VENTRICLE RV Basal diam:  4.00 cm RV Mid diam:    3.00 cm RV S prime:     8.24 cm/s TAPSE (M-mode): 1.5 cm RVSP:           31.9 mmHg  LEFT ATRIUM              Index        RIGHT ATRIUM           Index LA diam:        4.85 cm  2.32 cm/m   RA Pressure: 3.00 mmHg LA Vol (A2C):   71.1 ml  34.01 ml/m  RA Area:     23.50 cm LA Vol (A4C):   102.0 ml 48.79 ml/m  RA Volume:   78.30 ml  37.46 ml/m LA Biplane Vol: 88.6 ml  42.38 ml/m AORTIC VALVE LVOT Vmax:   69.35 cm/s LVOT Vmean:  44.275 cm/s LVOT VTI:    0.138 m  AORTA Ao Root diam: 3.60 cm Ao Asc diam:  3.95 cm  MITRAL VALVE               TRICUSPID VALVE MV Area (PHT): cm         TR Peak grad:   28.9 mmHg MV Decel Time: 150 msec    TR Vmax:        269.00 cm/s MV E velocity: 76.10 cm/s  Estimated RAP:  3.00 mmHg MV A velocity: 45.00 cm/s  RVSP:           31.9 mmHg MV E/A ratio:  1.69 SHUNTS Systemic VTI:  0.14 m Systemic Diam: 2.80 cm  Sunit Tolia Electronically signed by Madonna Large Signature Date/Time: 07/03/2023/4:30:42 PM    Final          ______________________________________________________________________________________________       Risk  Assessment/Calculations  CHA2DS2-VASc Score = 2  This indicates a 2.2% annual risk of stroke. The patient's score is based upon: CHF History: 1 HTN History: 1 Diabetes History: 0 Stroke History: 0 Vascular Disease History: 0 Age Score: 0 Gender Score: 0        Physical Exam VS:  There were no vitals taken for this visit.       Wt Readings from Last 3 Encounters:  09/17/23 218 lb (98.9 kg)  08/24/23 214 lb 9.6 oz (97.3 kg)  07/11/22 205 lb 9.6 oz (93.3 kg)    GEN: Well nourished, well developed in no acute distress.  Sitting comfortably on the exam table NECK: No JVD CARDIAC: Irregular rate and rhythm, no murmurs, rubs, gallops RESPIRATORY: Inspiratory and expiratory wheezing throughout.  Crackles on lung bases.  Normal work of breathing on room air ABDOMEN: Soft, non-tender, non-distended EXTREMITIES: Trace edema in bilateral lower extremities; No deformity   ASSESSMENT AND PLAN  Persistent atrial fibrillation - Patient was in A-fib at time of echo in 06/2023.  A-fib confirmed at office appointment 08/24/2023 and he was started on Xarelto . Continued to be in afib at appointment 8/25  - Now S/p DCCV on 8/28. Converted to NSR after 1 shock but converted back to afib very quickly   - HR is well controlled and patient denies palpitations. Wears exercise watch for HR management  - Discussed briefly with Dr. Ladona and EP- patient would likely benefit from antiarrhythmic therapy. Not ideal amio candidate with his COPD and young age. Not ideal for flecainide or multaq with reduced EF. Referred to afib clinic to discuss antiarrhythmic options, possibly tikosyn? - Continue metoprolol  succinate 50 mg daily   - Continue Xarelto  20 mg daily- denies bleeding. Ordered CBC for med monitoring   Nonischemic cardiomyopathy - Echocardiogram 07/03/2023 showed EF 40-45%, mild LVH, grade 1 DD, mildly reduced RV systolic function, mild MR - Patient previously had cath in 04/2020 that showed no  significant CAD.  Suspected that cardiomyopathy related to new onset A-fib/tachy mediated  - Reports mild DOE, ankle edema. Also notes orthopnea. Mildly volume up on exam today  - Increase lasix  to 40 mg daily - Continue Entresto  24/26 mg BID  - Continue metoprolol  succinate 50 mg daily - Patient reports that his current medications are quite costly and he is paying about as much as he can afford. Will hold off on SGLT2i for now  - No spiro with K 5.0 on 8/25, may be able to add in the future  - Creatinine 1.19 and K 5.0 on 8/25. Ordered BMP for med monitoring    Hypertension - BP has been well controlled since entresto  was started last visit  - Continue metoprolol  succinate 50 mg daily, entresto  24/26 mg BID - Increase lasix  as above  - BMP pending    OSA - Patient reports excellent compliance with CPAP   Aortic root dilation - Echocardiogram 06/2023 noted dilation of the ascending aorta measuring 39 mm.  Of note, this is likely normal for height and weight.  No further evaluation needed at this time   Chest pain  - Patient reports that he has been having episodes of chest pain for years. Described as twinges  - Patient previously had cardiac catheterization 04/2020 that did not show significant coronary disease.  Reports that his symptoms have been stable since before cath - Discussed possible stress test but through joint decision-making we decided to hold off for now.  Overall, chest pain is atypical as it occurs when he was at  rest.  Reassuring that symptoms have been stable for many years and he previously had clean cath.    Dispo: Afib referral, Dr. Ladona in 3 months   Signed, Rollo FABIENE Louder, PA-C

## 2023-09-28 NOTE — Telephone Encounter (Signed)
 Letter of results sent to pt

## 2023-10-03 ENCOUNTER — Other Ambulatory Visit (HOSPITAL_COMMUNITY)

## 2023-10-05 ENCOUNTER — Ambulatory Visit: Attending: Cardiology | Admitting: Cardiology

## 2023-10-05 ENCOUNTER — Encounter: Payer: Self-pay | Admitting: Cardiology

## 2023-10-05 VITALS — BP 125/79 | HR 63 | Ht 69.0 in | Wt 213.0 lb

## 2023-10-05 DIAGNOSIS — I77810 Thoracic aortic ectasia: Secondary | ICD-10-CM | POA: Diagnosis not present

## 2023-10-05 DIAGNOSIS — I4819 Other persistent atrial fibrillation: Secondary | ICD-10-CM

## 2023-10-05 DIAGNOSIS — I428 Other cardiomyopathies: Secondary | ICD-10-CM

## 2023-10-05 DIAGNOSIS — I10 Essential (primary) hypertension: Secondary | ICD-10-CM | POA: Diagnosis not present

## 2023-10-05 DIAGNOSIS — G4733 Obstructive sleep apnea (adult) (pediatric): Secondary | ICD-10-CM | POA: Diagnosis not present

## 2023-10-05 MED ORDER — FUROSEMIDE 20 MG PO TABS: 60.0000 mg | ORAL_TABLET | Freq: Every day | ORAL | 3 refills | Status: DC

## 2023-10-05 MED ORDER — FUROSEMIDE 20 MG PO TABS: 40.0000 mg | ORAL_TABLET | Freq: Every day | ORAL | 3 refills | Status: DC

## 2023-10-05 NOTE — Patient Instructions (Addendum)
 Medication Instructions:  Increase Lasix  60 mg take one tablet daily *If you need a refill on your cardiac medications before your next appointment, please call your pharmacy*  Lab Work: [facility] If you have labs (blood work) drawn today and your tests are completely normal, you will receive your results only by: MyChart Message (if you have MyChart) OR A paper copy in the mail If you have any lab test that is abnormal or we need to change your treatment, we will call you to review the results.  Follow-Up: At [facility], you and your health needs are our priority.  As part of our continuing mission to provide you with exceptional heart care, our providers are all part of one team.  This team includes your primary Cardiologist (physician) and Advanced Practice Providers or APPs (Physician Assistants and Nurse Practitioners) who all work together to provide you with the care you need, when you need it.  Your next appointment:   3-77month(s)  Provider:   Gordy Bergamo, MD    We recommend signing up for the patient portal called MyChart.  Sign up information is provided on this After Visit Summary.  MyChart is used to connect with patients for Virtual Visits (Telemedicine).  Patients are able to view lab/test results, encounter notes, upcoming appointments, etc.  Non-urgent messages can be sent to your provider as well.   To learn more about what you can do with MyChart, go to [email or web address].   Other Instructions FOLLOW UP WITH AFIB CLINIC

## 2023-10-06 LAB — BASIC METABOLIC PANEL WITH GFR
BUN/Creatinine Ratio: 11 (ref 10–24)
BUN: 15 mg/dL (ref 8–27)
CO2: 23 mmol/L (ref 20–29)
Calcium: 9.1 mg/dL (ref 8.6–10.2)
Chloride: 105 mmol/L (ref 96–106)
Creatinine, Ser: 1.41 mg/dL — ABNORMAL HIGH (ref 0.76–1.27)
Glucose: 90 mg/dL (ref 70–99)
Potassium: 4.5 mmol/L (ref 3.5–5.2)
Sodium: 143 mmol/L (ref 134–144)
eGFR: 57 mL/min/1.73 — ABNORMAL LOW (ref >59)

## 2023-10-06 LAB — CBC
Hematocrit: 41.8 % (ref 37.5–51.0)
Hemoglobin: 14 g/dL (ref 13.0–17.7)
MCH: 32.6 pg (ref 26.6–33.0)
MCHC: 33.5 g/dL (ref 31.5–35.7)
MCV: 97 fL (ref 79–97)
Platelets: 218 x10E3/uL (ref 150–450)
RBC: 4.3 x10E6/uL (ref 4.14–5.80)
RDW: 12.7 % (ref 11.6–15.4)
WBC: 7.3 x10E3/uL (ref 3.4–10.8)

## 2023-10-08 ENCOUNTER — Other Ambulatory Visit: Payer: Self-pay

## 2023-10-08 ENCOUNTER — Ambulatory Visit: Payer: Self-pay | Admitting: Cardiology

## 2023-10-08 DIAGNOSIS — Z79899 Other long term (current) drug therapy: Secondary | ICD-10-CM

## 2023-10-23 ENCOUNTER — Ambulatory Visit: Payer: Self-pay | Admitting: Cardiology

## 2023-10-23 LAB — BASIC METABOLIC PANEL WITH GFR
BUN/Creatinine Ratio: 12 (ref 10–24)
BUN: 17 mg/dL (ref 8–27)
CO2: 21 mmol/L (ref 20–29)
Calcium: 9.2 mg/dL (ref 8.6–10.2)
Chloride: 103 mmol/L (ref 96–106)
Creatinine, Ser: 1.43 mg/dL — ABNORMAL HIGH (ref 0.76–1.27)
Glucose: 100 mg/dL — ABNORMAL HIGH (ref 70–99)
Potassium: 4.5 mmol/L (ref 3.5–5.2)
Sodium: 140 mmol/L (ref 134–144)
eGFR: 56 mL/min/1.73 — ABNORMAL LOW (ref >59)

## 2023-10-24 NOTE — Progress Notes (Signed)
 Pt has been made aware of normal result and verbalized understanding.  jw

## 2023-10-30 ENCOUNTER — Ambulatory Visit (HOSPITAL_COMMUNITY)
Admission: RE | Admit: 2023-10-30 | Discharge: 2023-10-30 | Disposition: A | Discharge status: Home / Self Care | Source: Ambulatory Visit | Attending: Internal Medicine | Admitting: Internal Medicine

## 2023-10-30 ENCOUNTER — Encounter (HOSPITAL_COMMUNITY): Payer: Self-pay | Admitting: Internal Medicine

## 2023-10-30 VITALS — BP 110/86 | HR 82 | Ht 69.0 in | Wt 210.8 lb

## 2023-10-30 DIAGNOSIS — I4819 Other persistent atrial fibrillation: Secondary | ICD-10-CM | POA: Diagnosis not present

## 2023-10-30 DIAGNOSIS — D6869 Other thrombophilia: Secondary | ICD-10-CM | POA: Diagnosis not present

## 2023-10-30 MED ORDER — AMIODARONE HCL 200 MG PO TABS: ORAL_TABLET | ORAL | 3 refills | Status: DC

## 2023-10-30 NOTE — Progress Notes (Signed)
 Primary Care Physician: Maree Isles, MD Primary Cardiologist: Gordy Bergamo, MD Electrophysiologist: None     Referring Physician: Vicci Rollo SAUNDERS, PA-C     [name] [name] is a 63 y.o. male with a history of HTN, OSA on BiPAP, COPD, heavy tobacco use, NICM, and atrial fibrillation who presents for consultation in the [facility]. S/p DCCV on 8/28 with ERAF. Patient is on Xarelto  for a CHADS2VASC score of 2.  On evaluation today, patient is currently in Afib. He notes fatigue and SOB with exertion when out of rhythm. He has not missed any doses of Xarelto .   Today, he denies symptoms of palpitations, chest pain, orthopnea, PND, lower extremity edema, dizziness, presyncope, syncope, bleeding, or neurologic sequela. The patient is tolerating medications without difficulties and is otherwise without complaint today.    Atrial Fibrillation Risk Factors:  he does have symptoms or diagnosis of sleep apnea. he is compliant with CPAP therapy.   he has a BMI of Body mass index is 31.13 kg/m.SABRA Filed Weights   10/30/23 0900  Weight: 95.6 kg    Current Outpatient Medications  Medication Sig Dispense Refill   albuterol  (VENTOLIN  HFA) 108 (90 Base) MCG/ACT inhaler Inhale 2 puffs into the lungs every 4 (four) hours as needed for shortness of breath.     amiodarone (PACERONE) 200 MG tablet Take 1 tablet (200 mg total) by mouth 2 (two) times daily for 30 days, THEN 1 tablet (200 mg total) 2 (two) times daily. 60 tablet 3   Aromatic Inhalants (VICKS VAPOR INHALER IN) Inhale 1 Inhalation into the lungs daily as needed (Congestion). Inhaler stick     atorvastatin  (LIPITOR) 40 MG tablet Take 1 tablet (40 mg total) by mouth daily. 90 tablet 3   busPIRone (BUSPAR) 10 MG tablet Take 10 mg by mouth 3 (three) times daily.     furosemide  (LASIX ) 20 MG tablet Take 2 tablets (40 mg total) by mouth daily. 270 tablet 3   gabapentin  (NEURONTIN ) 300 MG capsule Take 300 mg by  mouth See admin instructions. AM : 1 tablet , NOON : 1 tablet, PM 2 tablets     metoprolol  succinate (TOPROL -XL) 50 MG 24 hr tablet Take 1 tablet (50 mg total) by mouth every evening. 90 tablet 1   Multiple Vitamin (MULTIVITAMIN) capsule Take 1 capsule by mouth daily. One a day 50+     omeprazole (PRILOSEC) 40 MG capsule Take 20 mg by mouth daily.     oxyCODONE -acetaminophen  (PERCOCET/ROXICET) 5-325 MG tablet Take 1 tablet by mouth every 12 (twelve) hours.     rivaroxaban  (XARELTO ) 20 MG TABS tablet Take 1 tablet (20 mg total) by mouth daily with supper. 30 tablet 2   sacubitril -valsartan  (ENTRESTO ) 24-26 MG Take 1 tablet by mouth 2 (two) times daily. 180 tablet 3   sodium chloride  (OCEAN) 0.65 % SOLN nasal spray Place 1 spray into both nostrils daily as needed for congestion.     tiZANidine  (ZANAFLEX ) 4 MG tablet Take 4 mg by mouth 3 (three) times daily.     No current facility-administered medications for this encounter.    Atrial Fibrillation Management history:  Previous antiarrhythmic drugs: none Previous cardioversions: 09/20/23 Previous ablations: none Anticoagulation history: Xarelto    ROS- All systems are reviewed and negative except as per the HPI above.  Physical Exam: BP 110/86   Pulse 82   Ht 5' 9 (1.753 m)   Wt 95.6 kg   BMI 31.13 kg/m   GEN:  Well nourished, well developed in no acute distress NECK: No JVD; No carotid bruits CARDIAC: Irregularly irregular rate and rhythm, no murmurs, rubs, gallops RESPIRATORY:  Clear to auscultation without rales, wheezing or rhonchi  ABDOMEN: Soft, non-tender, non-distended EXTREMITIES:  No edema; No deformity   EKG today demonstrates  Vent. rate 82 BPM PR interval * ms QRS duration 96 ms QT/QTcB 378/441 ms P-R-T axes * 47 -38 Atrial fibrillation ST & T wave abnormality, consider anterolateral ischemia Abnormal ECG When compared with ECG of 05-Oct-2023 10:05, Previous ECG is present  Echo 07/03/23 demonstrated  1. Left  ventricular ejection fraction, by estimation, is 40 to 45%. The  left ventricle has mildly decreased function. The left ventricle  demonstrates global hypokinesis. The left ventricular internal cavity size  was upper limit of normal. There is mild  left ventricular hypertrophy. Left ventricular diastolic parameters are  consistent with Grade I diastolic dysfunction (impaired relaxation).   2. Right ventricular systolic function is mildly reduced. The right  ventricular size is normal. There is normal pulmonary artery systolic  pressure.   3. Left atrial size was moderately dilated.   4. Right atrial size was mildly dilated.   5. The mitral valve is normal in structure. Mild mitral valve  regurgitation. No evidence of mitral stenosis.   6. The aortic valve is grossly normal. Aortic valve regurgitation is not  visualized. No aortic stenosis is present.   7. Aortic dilatation noted. There is dilatation of the ascending aorta,  measuring 39 mm.   8. The inferior vena cava is normal in size with greater than 50%  respiratory variability, suggesting right atrial pressure of 3 mmHg.   ASSESSMENT & PLAN CHA2DS2-VASc Score = 2  The patient's score is based upon: CHF History: 1 HTN History: 1 Diabetes History: 0 Stroke History: 0 Vascular Disease History: 0 Age Score: 0 Gender Score: 0       ASSESSMENT AND PLAN: Persistent Atrial Fibrillation (ICD10:  I48.19) The patient's CHA2DS2-VASc score is 2, indicating a 2.2% annual risk of stroke.    Patient is currently in Afib. He had ERAF following cardioversion. We had a discussion about medication treatments and ablation in detail. We discussed potential options such as Tikosyn, and amiodarone. We talked about the monitoring required for these medications, hospital admission for Tikosyn, and potential adverse effects. We also talked about the potential adverse effects of amiodarone. We also discussed pulse field ablation as an option in the  form of intervention. After discussion, patient is interested in speaking with EP regarding potential for Afib ablation. He would like to begin AAD therapy in the form of amiodarone. Will begin amiodarone load 200 mg BID x 1 month then transition to 200 mg once daily. Follow up in several weeks to reassess / determine if needs repeat cardioversion.    Secondary Hypercoagulable State (ICD10:  D68.69) The patient is at significant risk for stroke/thromboembolism based upon his CHA2DS2-VASc Score of 2.  Continue Rivaroxaban  (Xarelto ).  No missed doses.    Follow up 3 weeks. Refer to EP to discuss ablation.   [facility], [facility]  Afib Clinic [address] [place], [place] [address] [phone]

## 2023-10-30 NOTE — Patient Instructions (Signed)
 Start Amiodarone 200 mg twice a day for 30 days then decrease to 200 mg once a day  Do not miss any doses of blood thinner

## 2023-11-02 ENCOUNTER — Telehealth: Payer: Self-pay | Admitting: Cardiology

## 2023-11-02 NOTE — Telephone Encounter (Signed)
 Patient wants a call back to discuss next steps regarding cardioversion, ablation and his medication.

## 2023-11-02 NOTE — Telephone Encounter (Signed)
 Spoke with pt regarding next steps in his care. Pt was interested in knowing more about an ablation vs a cardioversion. Pt was told that there was a referral to EP placed on 10/7 and that he will receive a call to make an appointment with an EP doctor who can go over everything with him. Pt verbalized understanding. All questions if any were answered.

## 2023-11-12 ENCOUNTER — Other Ambulatory Visit: Payer: Self-pay | Admitting: Cardiology

## 2023-11-12 NOTE — Telephone Encounter (Signed)
 Prescription refill request for Xarelto  received.  Indication:afib Last office visit:10/25 Weight:95.6  kg Age:63 Scr:1.43  9/25 CrCl:73.35  ml/min  Prescription refilled

## 2023-11-20 ENCOUNTER — Encounter (HOSPITAL_COMMUNITY): Payer: Self-pay | Admitting: Internal Medicine

## 2023-11-20 ENCOUNTER — Ambulatory Visit (HOSPITAL_COMMUNITY)
Admission: RE | Admit: 2023-11-20 | Discharge: 2023-11-20 | Disposition: A | Discharge status: Home / Self Care | Source: Ambulatory Visit | Attending: Internal Medicine | Admitting: Internal Medicine

## 2023-11-20 VITALS — BP 116/84 | HR 49 | Ht 69.0 in | Wt 214.0 lb

## 2023-11-20 DIAGNOSIS — Z5181 Encounter for therapeutic drug level monitoring: Secondary | ICD-10-CM

## 2023-11-20 DIAGNOSIS — Z79899 Other long term (current) drug therapy: Secondary | ICD-10-CM

## 2023-11-20 DIAGNOSIS — D6869 Other thrombophilia: Secondary | ICD-10-CM

## 2023-11-20 DIAGNOSIS — I4819 Other persistent atrial fibrillation: Secondary | ICD-10-CM

## 2023-11-20 NOTE — Progress Notes (Signed)
 Primary Care Physician: Maree Isles, MD Primary Cardiologist: Gordy Bergamo, MD Electrophysiologist: None     Referring Physician: Vicci Rollo SAUNDERS, PA-C     [name] [name] is a 63 y.o. male with a history of HTN, OSA on BiPAP, COPD, heavy tobacco use, NICM, and atrial fibrillation who presents for consultation in the [facility]. S/p DCCV on 8/28 with ERAF. Patient is on Xarelto  for a CHADS2VASC score of 2.  On follow up 11/20/23, patient is currently in NSR. He began amiodarone at last office visit for rhythm control and is scheduled to speak with Dr. Almetta regarding ablation. No missed doses of Xarelto . He is happy to be back in normal rhythm.  Today, he denies symptoms of palpitations, chest pain, orthopnea, PND, lower extremity edema, dizziness, presyncope, syncope, bleeding, or neurologic sequela. The patient is tolerating medications without difficulties and is otherwise without complaint today.    Atrial Fibrillation Risk Factors:  he does have symptoms or diagnosis of sleep apnea. he is compliant with CPAP therapy.   he has a BMI of Body mass index is 31.6 kg/m.SABRA Filed Weights   11/20/23 1029  Weight: 97.1 kg     Current Outpatient Medications  Medication Sig Dispense Refill   albuterol  (VENTOLIN  HFA) 108 (90 Base) MCG/ACT inhaler Inhale 2 puffs into the lungs every 4 (four) hours as needed for shortness of breath.     amiodarone (PACERONE) 200 MG tablet Take 1 tablet (200 mg total) by mouth 2 (two) times daily for 30 days, THEN 1 tablet (200 mg total) 2 (two) times daily. 60 tablet 3   Aromatic Inhalants (VICKS VAPOR INHALER IN) Inhale 1 Inhalation into the lungs daily as needed (Congestion). Inhaler stick     atorvastatin  (LIPITOR) 40 MG tablet Take 1 tablet (40 mg total) by mouth daily. 90 tablet 3   busPIRone (BUSPAR) 10 MG tablet Take 10 mg by mouth 3 (three) times daily.     furosemide  (LASIX ) 20 MG tablet Take 2 tablets (40  mg total) by mouth daily. 270 tablet 3   gabapentin  (NEURONTIN ) 300 MG capsule Take 300 mg by mouth See admin instructions. AM : 1 tablet , NOON : 1 tablet, PM 2 tablets     metoprolol  succinate (TOPROL -XL) 50 MG 24 hr tablet Take 1 tablet (50 mg total) by mouth every evening. 90 tablet 1   Multiple Vitamin (MULTIVITAMIN) capsule Take 1 capsule by mouth daily. One a day 50+     omeprazole (PRILOSEC) 40 MG capsule Take 20 mg by mouth daily.     oxyCODONE -acetaminophen  (PERCOCET/ROXICET) 5-325 MG tablet Take 1 tablet by mouth every 12 (twelve) hours.     rivaroxaban  (XARELTO ) 20 MG TABS tablet TAKE 1 TABLET BY MOUTH ONCE DAILY WITH SUPPER 30 tablet 5   sacubitril -valsartan  (ENTRESTO ) 24-26 MG Take 1 tablet by mouth 2 (two) times daily. 180 tablet 3   sodium chloride  (OCEAN) 0.65 % SOLN nasal spray Place 1 spray into both nostrils daily as needed for congestion.     tiZANidine  (ZANAFLEX ) 4 MG tablet Take 4 mg by mouth 3 (three) times daily.     No current facility-administered medications for this encounter.    Atrial Fibrillation Management history:  Previous antiarrhythmic drugs: amiodarone Previous cardioversions: 09/20/23 Previous ablations: none Anticoagulation history: Xarelto    ROS- All systems are reviewed and negative except as per the HPI above.  Physical Exam: BP 116/84   Pulse (!) 49   Ht 5' 9 (  1.753 m)   Wt 97.1 kg   BMI 31.60 kg/m   GEN- The patient is well appearing, alert and oriented x 3 today.   Neck - no JVD or carotid bruit noted Lungs- Clear to ausculation bilaterally, normal work of breathing Heart- Regular bradycardic rate and rhythm, no murmurs, rubs or gallops, PMI not laterally displaced Extremities- no clubbing, cyanosis, or edema Skin - no rash or ecchymosis noted   EKG today demonstrates  Vent. rate 49 BPM PR interval 160 ms QRS duration 98 ms QT/QTcB 480/433 ms P-R-T axes 6 44 145 Sinus bradycardia ST & T wave abnormality, consider  anterolateral ischemia Abnormal ECG When compared with ECG of 30-Oct-2023 09:01, Sinus rhythm has replaced Atrial fibrillation  Echo 07/03/23 demonstrated  1. Left ventricular ejection fraction, by estimation, is 40 to 45%. The  left ventricle has mildly decreased function. The left ventricle  demonstrates global hypokinesis. The left ventricular internal cavity size  was upper limit of normal. There is mild  left ventricular hypertrophy. Left ventricular diastolic parameters are  consistent with Grade I diastolic dysfunction (impaired relaxation).   2. Right ventricular systolic function is mildly reduced. The right  ventricular size is normal. There is normal pulmonary artery systolic  pressure.   3. Left atrial size was moderately dilated.   4. Right atrial size was mildly dilated.   5. The mitral valve is normal in structure. Mild mitral valve  regurgitation. No evidence of mitral stenosis.   6. The aortic valve is grossly normal. Aortic valve regurgitation is not  visualized. No aortic stenosis is present.   7. Aortic dilatation noted. There is dilatation of the ascending aorta,  measuring 39 mm.   8. The inferior vena cava is normal in size with greater than 50%  respiratory variability, suggesting right atrial pressure of 3 mmHg.   ASSESSMENT & PLAN CHA2DS2-VASc Score = 2  The patient's score is based upon: CHF History: 1 HTN History: 1 Diabetes History: 0 Stroke History: 0 Vascular Disease History: 0 Age Score: 0 Gender Score: 0       ASSESSMENT AND PLAN: Persistent Atrial Fibrillation (ICD10:  I48.19) The patient's CHA2DS2-VASc score is 2, indicating a 2.2% annual risk of stroke.    Patient is currently in NSR. We revisited ablation education again so he knows what to expect about the procedure. Noted again amiodarone is a bridge to ablation and temporary.  High risk medication monitoring (ICD10: [id number]) Patient requires ongoing monitoring for anti-arrhythmic  medication which has the potential to cause life threatening arrhythmias or AV block. Qtc stable. Continue amiodarone 200 mg twice daily and transition next week to 200 mg once daily.   Secondary Hypercoagulable State (ICD10:  D68.69) The patient is at significant risk for stroke/thromboembolism based upon his CHA2DS2-VASc Score of 2.  Continue Rivaroxaban  (Xarelto ).  No missed doses.     Follow up as scheduled with EP.    Terra Pac, [facility]  Af[facility] Clinic [address] [place], [place] [address] [phone]

## 2023-11-30 DIAGNOSIS — F1120 Opioid dependence, uncomplicated: Secondary | ICD-10-CM | POA: Diagnosis not present

## 2023-11-30 DIAGNOSIS — M5442 Lumbago with sciatica, left side: Secondary | ICD-10-CM | POA: Diagnosis not present

## 2023-11-30 DIAGNOSIS — G8929 Other chronic pain: Secondary | ICD-10-CM | POA: Diagnosis not present

## 2023-11-30 DIAGNOSIS — M47816 Spondylosis without myelopathy or radiculopathy, lumbar region: Secondary | ICD-10-CM | POA: Diagnosis not present

## 2023-11-30 DIAGNOSIS — M47812 Spondylosis without myelopathy or radiculopathy, cervical region: Secondary | ICD-10-CM | POA: Diagnosis not present

## 2023-12-13 ENCOUNTER — Ambulatory Visit
Attending: Student in an Organized Health Care Education/Training Program | Admitting: Student in an Organized Health Care Education/Training Program

## 2023-12-13 ENCOUNTER — Encounter: Payer: Self-pay | Admitting: Student in an Organized Health Care Education/Training Program

## 2023-12-13 VITALS — BP 110/68 | HR 64 | Ht 69.0 in | Wt 207.0 lb

## 2023-12-13 DIAGNOSIS — Z01812 Encounter for preprocedural laboratory examination: Secondary | ICD-10-CM | POA: Diagnosis not present

## 2023-12-13 DIAGNOSIS — I4819 Other persistent atrial fibrillation: Secondary | ICD-10-CM
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
## 2023-12-13 NOTE — Patient Instructions (Addendum)
 Medication Instructions:  Your physician recommends that you continue on your current medications as directed. Please refer to the Current Medication list given to you today.  *If you need a refill on your cardiac medications before your next appointment, please call your pharmacy*  Lab Work: CBC and BMET prior to procedure  If you have labs (blood work) drawn today and your tests are completely normal, you will receive your results only by: MyChart Message (if you have MyChart) OR A paper copy in the mail If you have any lab test that is abnormal or we need to change your treatment, we will call you to review the results.  Testing/Procedures: Your physician has recommended that you have an ablation. Catheter ablation is a medical procedure used to treat some cardiac arrhythmias (irregular heartbeats). During catheter ablation, a long, thin, flexible tube is put into a blood vessel in your groin (upper thigh), or neck. This tube is called an ablation catheter. It is then guided to your heart through the blood vessel. Radio frequency waves destroy small areas of heart tissue where abnormal heartbeats may cause an arrhythmia to start. Please see the instruction sheet given to you today.   Cardiac CT Your physician has requested that you have cardiac CT. Cardiac computed tomography (CT) is a painless test that uses an x-ray machine to take clear, detailed pictures of your heart. For further information please visit [email or web address] Please follow instruction sheet as given. You will be called to schedule this test.   Watchman  Your physician has requested that you have Left atrial appendage (LAA) closure device implantation is a procedure to put a small device in the LAA of the heart. The LAA is a small sac in the wall of the heart's left upper chamber. Blood clots can form in this area. The device, Watchman closes the LAA to help prevent a blood clot and stroke.  You will be contacted by Nurse  Navigator, Danielle to schedule your pre-procedure visit and procedure date. If you have any questions she can be reached at [phone].   Follow-Up: At [facility], you and your health needs are our priority.  As part of our continuing mission to provide you with exceptional heart care, our providers are all part of one team.  This team includes your primary Cardiologist (physician) and Advanced Practice Providers or APPs (Physician Assistants and Nurse Practitioners) who all work together to provide you with the care you need, when you need it.  WATCHMAN CT INSTRUCTIONS: Your WATCHMAN CT will be scheduled at the [facility] ([address]). You will be called to confirm appointment date and time.  The day of your CT appointment, please use the FREE valet parking offered at the [facility] entrance (encouraged to control the heart rate for the test). You will check in on the first floor then proceed to the second floor to complete the registration process and for CT scanning.   Please follow these instructions carefully:  Hold all erectile dysfunction medications at least 3 days (72 hrs) prior to test.  On the Night Before the Test: Be sure to Drink plenty of water. Do not consume any caffeinated/decaffeinated beverages or chocolate 12 hours prior to your test. Do not take any antihistamines 12 hours prior to your test.   On the Day of the Test: Drink plenty of water, but do not eat or drink 1 hour prior to your scheduled CT appointment time. You  may take your regular medications prior to the test.   HOLD Furosemide /Hydrochlorothiazide morning of the test. Please wear underwire-free bra if available      After the Test: Drink plenty of water. After receiving IV contrast, you may experience a mild flushed feeling. This is normal. On occasion, you may experience a mild rash up to 24 hours after the test. This is not dangerous.  If this occurs, you can take Benadryl 25 mg and increase your fluid intake. If you experience trouble breathing, this can be serious. If it is severe call 911 IMMEDIATELY. If it is mild, please call our office. If you take any of these medications: Glipizide/Metformin, Avandament, Glucavance, please do not take 48 hours after completing test unless otherwise instructed.  Once we have confirmed authorization from your insurance company, you will be called to set up a date and time for your test.   For non-scheduling related questions/concerns about your CT scan, please contact the cardiac imaging nurses: Camie Shutter, Cardiac Imaging Nurse Navigator Chantal Requena, Cardiac Imaging Nurse Navigator [facility] Direct Office Dial: [phone]   For scheduling needs, including cancellations and rescheduling, please call Brittany, [phone].  The Watchman Nurse Navigator will contact you after your CT to discuss results and schedule your procedure.    Thank you!

## 2023-12-26 DIAGNOSIS — G4733 Obstructive sleep apnea (adult) (pediatric): Secondary | ICD-10-CM | POA: Diagnosis not present

## 2023-12-28 ENCOUNTER — Encounter: Payer: Self-pay | Admitting: Cardiology

## 2023-12-28 ENCOUNTER — Telehealth: Payer: Self-pay

## 2023-12-28 ENCOUNTER — Ambulatory Visit: Attending: Cardiology | Admitting: Cardiology

## 2023-12-28 VITALS — BP 108/66 | HR 50 | Ht 69.0 in | Wt 204.8 lb

## 2023-12-28 DIAGNOSIS — I428 Other cardiomyopathies: Secondary | ICD-10-CM | POA: Diagnosis not present

## 2023-12-28 DIAGNOSIS — N289 Disorder of kidney and ureter, unspecified: Secondary | ICD-10-CM

## 2023-12-28 DIAGNOSIS — R7989 Other specified abnormal findings of blood chemistry: Secondary | ICD-10-CM

## 2023-12-28 DIAGNOSIS — N1831 Chronic kidney disease, stage 3a: Secondary | ICD-10-CM | POA: Diagnosis not present

## 2023-12-28 DIAGNOSIS — Z01812 Encounter for preprocedural laboratory examination: Secondary | ICD-10-CM | POA: Diagnosis not present

## 2023-12-28 DIAGNOSIS — Z5181 Encounter for therapeutic drug level monitoring: Secondary | ICD-10-CM | POA: Diagnosis not present

## 2023-12-28 DIAGNOSIS — I4819 Other persistent atrial fibrillation: Secondary | ICD-10-CM

## 2023-12-28 LAB — CBC

## 2023-12-28 MED ORDER — FUROSEMIDE 20 MG PO TABS: 40.0000 mg | ORAL_TABLET | Freq: Every day | ORAL | Status: DC | PRN

## 2023-12-28 NOTE — Telephone Encounter (Signed)
 Spoke with patient to arrange pre-concomitant ablation/watchman CT. Scheduled for 01/21/24.   Patient states Dr. Ladona would like to speak with Dr. Almetta before patient proceeds with concomitant procedure. (Tenative date 02/19/2024).   Advised patient will communicate with Dr. Ladona and Dr. Almetta. Will call him to cancel CT if this is no longer needed.   Patient stated he had labs drawn today. Will confirm labs are sufficient for CT scan and if not will notify patient. CT letter will be mailed as patient doesn't use MyChart. He was grateful for the call.

## 2023-12-28 NOTE — Progress Notes (Unsigned)
 Cardiology Office Note:  .   Date:  12/29/2023  ID:  JAYZIAH BANKHEAD, DOB 10/18/1960, MRN [id number] PCP: Maree Isles, MD  [facility] Providers Cardiologist:  Gordy Bergamo, MD Electrophysiologist:  Donnice DELENA Primus, MD   History of Present Illness: .   BRYN PERKIN is a 63 y.o. Caucasian male patient with heavy tobacco use disorder (>41-pack-year history), COPD with centrilobular and paraseptal emphysema, severe sleep apnea not compliant with CPAP, disabled due to spinal stenosis and chronic back pain and COPD with severe chronic dyspnea, severe obstructive sleep apnea on BiPAP, hypertension.    Left heart catheterization coronary angiography 05/18/2020 which revealed no significant coronary artery disease.  He had an echocardiogram on 07/03/2023 which revealed ejection fraction of 40 to 45% and he was in sinus rhythm with grade 1 diastolic dysfunction and ascending aorta measured 39 m which is stable from 2023.    He was also found to be in new onset atrial fibrillation on 09/12/2023 and was started on rivaroxaban .  Underwent direct-current cardioversion on 09/20/2023 but reverted back to A-fib immediately and was started on amiodarone  after discussion with the EP.   On his last  A-fib clinic visit on 11/12/2023 and was maintaining sinus rhythm.  He has been compliant with his BiPAP.    Discussed the use of AI scribe software for clinical note transcription with the patient, who gave verbal consent to proceed.  History of Present Illness VRISHANK MOSTER is a 63 year old male with atrial fibrillation who presents for cardiovascular follow-up and management of hip pain.  He has severe right hip and knee pain that limits walking and requires a cane. Pain is worse lying on his side and interferes with sleep, as he cannot sleep flat on his back.  He has atrial fibrillation treated with prior cardioversion and amiodarone . He now takes amiodarone  200 mg once each morning and Xarelto   20 mg once daily without bleeding.  He has chronic swelling of his feet and calves that he attributes to fluid retention. Furosemide  20 mg, two tablets daily, provides incomplete relief, and edema sometimes prevents him from wearing shoes comfortably. He reports a high-salt diet with frequent salty snacks.  He has severe sleep apnea on BiPAP. Use is inconsistent due to nasal congestion and sinus problems, which limit his ability to tolerate the full-face mask.  He has significant depression and emotional distress after family losses. He is not planning to return to work and has financial difficulty obtaining help with tasks, which may limit his ability to manage his health needs.  Cardiac Studies relevent.    ECHOCARDIOGRAM COMPLETE 07/03/2023  1. Left ventricular ejection fraction, by estimation, is 40 to 45%. The left ventricle has mildly decreased function. The left ventricle demonstrates global hypokinesis. The left ventricular internal cavity size was upper limit of normal. There is mild left ventricular hypertrophy. Left ventricular diastolic parameters are consistent with Grade I diastolic dysfunction (impaired relaxation). 2. Right ventricular systolic function is mildly reduced. The right ventricular size is normal. There is normal pulmonary artery systolic pressure. 3. Left atrial size was moderately dilated. 4. Right atrial size was mildly dilated.  CARDIAC CATHETERIZATION 05/18/2020    Labs   Lab Results  Component Value Date   NA 142 12/28/2023   K 4.6 12/28/2023   CO2 22 12/28/2023   GLUCOSE 109 (H) 12/28/2023   BUN 18 12/28/2023   CREATININE 1.41 (H) 12/28/2023   CALCIUM  9.1 12/28/2023   EGFR 57 (L)  12/28/2023   GFRNONAA >60 05/18/2020    Recent Labs    10/05/23 1140 10/22/23 1236 12/28/23 1053  NA 143 140 142  K 4.5 4.5 4.6  CL 105 103 105  CO2 23 21 22   GLUCOSE 90 100* 109*  BUN 15 17 18   CREATININE 1.41* 1.43* 1.41*  CALCIUM  9.1 9.2 9.1    Lab Results   Component Value Date   ALT 13 04/02/2020   AST 15 04/02/2020   ALKPHOS 46 04/02/2020   BILITOT 0.5 04/02/2020      Latest Ref Rng & Units 12/28/2023   10:53 AM 10/05/2023   11:40 AM 09/17/2023   10:05 AM  CBC  WBC 3.4 - 10.8 x10E3/uL 6.7  7.3  7.0   Hemoglobin 13.0 - 17.7 g/dL 86.5  85.9  87.0   Hematocrit 37.5 - 51.0 % 39.9  41.8  38.3   Platelets 150 - 450 x10E3/uL 226  218  251    No results found for: HGBA1C  Lab Results  Component Value Date   TSH 1.600 12/28/2023  Normal T4 and T3.  ROS  Review of Systems  Cardiovascular:  Positive for dyspnea on exertion (stable). Negative for chest pain and leg swelling.  Respiratory:  Positive for cough (chronic).    Physical Exam:   VS:  BP 108/66 (BP Location: Left Arm, Patient Position: Sitting, Cuff Size: Large)   Pulse (!) 50   Ht 5' 9 (1.753 m)   Wt 204 lb 12.8 oz (92.9 kg)   SpO2 97%   BMI 30.24 kg/m    Wt Readings from Last 3 Encounters:  12/28/23 204 lb 12.8 oz (92.9 kg)  12/13/23 207 lb (93.9 kg)  11/20/23 214 lb (97.1 kg)    BP Readings from Last 3 Encounters:  12/28/23 108/66  12/13/23 110/68  11/20/23 116/84   Physical Exam Neck:     Vascular: No carotid bruit or JVD.  Cardiovascular:     Rate and Rhythm: Normal rate and regular rhythm.     Pulses: Intact distal pulses.     Heart sounds: Normal heart sounds. No murmur heard.    No gallop.  Pulmonary:     Effort: Pulmonary effort is normal.     Breath sounds: Rhonchi (diffuse extensive) present.  Abdominal:     General: Bowel sounds are normal.     Palpations: Abdomen is soft.  Musculoskeletal:     Right lower leg: No edema.     Left lower leg: No edema.    EKG:         ASSESSMENT AND PLAN: .      ICD-10-CM   1. Persistent atrial fibrillation (HCC)  I48.19     2. Non-ischemic cardiomyopathy (HCC)  I42.8 furosemide  (LASIX ) 20 MG tablet    3. Stage 3a chronic kidney disease (HCC)  N18.31     4. Therapeutic drug monitoring  Z51.81  TSH+T4F+T3Free    5. Abnormal TSH  R79.89 TSH+T4F+T3Free     Assessment & Plan Persistent atrial fibrillation Currently in regular rhythm after cardioversion and initiation of amiodarone . Discussed potential AFib ablation and Watchman device placement. Risks of Watchman include bleeding, infection, and left atrial appendage rupture. Benefits include potential discontinuation of anticoagulation. Shared decision-making emphasized, with preference for medical therapy with regard to anticoagulation unless complications arise.  Proceed with A-fib ablation. - Continue amiodarone  200 mg once daily - Continue Xarelto  20 mg once daily - Personally discussed with Dr. Adina Primus, will proceed with AF ablation  stand alone in view of no contraindications for anticoagulation and patient willing to continue anticoagulation. Not a high risk patient for bleeding complications.  - Check TSH and T3,T4 in view of abnormal TSH (reviewed and normal thyroid  function)  Non-ischemic cardiomyopathy with heart failure Heart function reduced during AFib episodes. Current medications include Entresto , furosemide , and metoprolol . Dietary habits contributing to fluid retention and kidney function decline. - Continue Entresto  24/26 mg - Changed furosemide  to as needed for leg swelling - Continue metoprolol  succinate 50 mg every evening - Advised dietary modifications to reduce salt intake  Chronic renal insufficiency Kidney function decline likely due to hypertension, high-dose furosemide  and dietary habits. Emphasized dietary changes to reduce salt intake and adjust furosemide  use. - Changed furosemide  to as needed for leg swelling - Advised dietary modifications to reduce salt intake - BMP reviewed, serum creatinine of 1.4 is at baseline and unchanged from 2022.  Severe obstructive sleep apnea Regular BiPAP use. Nasal congestion affecting BiPAP use. Discussed importance of BiPAP adherence to prevent AFib  recurrence. - Use humidifier in room during nasal congestion - Use Afrin for nasal congestion - Continue regular BiPAP use  Abnormal thyroid  function TSH abnormal prior to amiodarone  initiation. No current symptoms of thyroid  dysfunction. Plan to monitor thyroid  function due to potential amiodarone  effects. - Ordered thyroid  function tests - Reviewed, TSH, T3 and T4 normal.  Follow up: PAF, Hypertension, renal failure, cardiomyopathy Consider repeat Echo after visit  Signed,  Gordy Bergamo, MD, [facility] 12/29/2023, 8:11 AM [facility] [address] [place], [place] [address] Phone: [phone]. Fax:  [phone]

## 2023-12-28 NOTE — Patient Instructions (Addendum)
 Medication Instructions:  CHANGE LASIX  to PRN ( as needed) *If you need a refill on your cardiac medications before your next appointment, please call your pharmacy*  Lab Work: [phone] (today)  If you have labs (blood work) drawn today and your tests are completely normal, you will receive your results only by: MyChart Message (if you have MyChart) OR A paper copy in the mail If you have any lab test that is abnormal or we need to change your treatment, we will call you to review the results.  Testing/Procedures: **REMINDER**  ECHOCARDIOGRAM 01/30/2024  Follow-Up: At [facility], you and your health needs are our priority.  As part of our continuing mission to provide you with exceptional heart care, our providers are all part of one team.  This team includes your primary Cardiologist (physician) and Advanced Practice Providers or APPs (Physician Assistants and Nurse Practitioners) who all work together to provide you with the care you need, when you need it.  Your next appointment:   3 month(s)   (Monday, March 31, 2024)   Provider:   Gordy Bergamo, MD

## 2023-12-29 ENCOUNTER — Ambulatory Visit: Payer: Self-pay | Admitting: Cardiology

## 2023-12-29 LAB — BASIC METABOLIC PANEL WITH GFR
BUN/Creatinine Ratio: 13 (ref 10–24)
BUN: 18 mg/dL (ref 8–27)
CO2: 22 mmol/L (ref 20–29)
Calcium: 9.1 mg/dL (ref 8.6–10.2)
Chloride: 105 mmol/L (ref 96–106)
Creatinine, Ser: 1.41 mg/dL — ABNORMAL HIGH (ref 0.76–1.27)
Glucose: 109 mg/dL — ABNORMAL HIGH (ref 70–99)
Potassium: 4.6 mmol/L (ref 3.5–5.2)
Sodium: 142 mmol/L (ref 134–144)
eGFR: 57 mL/min/1.73 — ABNORMAL LOW (ref >59)

## 2023-12-29 LAB — CBC
Hematocrit: 39.9 % (ref 37.5–51.0)
Hemoglobin: 13.4 g/dL (ref 13.0–17.7)
MCH: 32.3 pg (ref 26.6–33.0)
MCHC: 33.6 g/dL (ref 31.5–35.7)
MCV: 96 fL (ref 79–97)
Platelets: 226 x10E3/uL (ref 150–450)
RBC: 4.15 x10E6/uL (ref 4.14–5.80)
RDW: 12.2 % (ref 11.6–15.4)
WBC: 6.7 x10E3/uL (ref 3.4–10.8)

## 2023-12-29 LAB — TSH+T4F+T3FREE
Free T4: 1.43 ng/dL (ref 0.82–1.77)
T3, Free: 2.9 pg/mL (ref 2.0–4.4)
TSH: 1.6 u[IU]/mL (ref 0.450–4.500)

## 2023-12-29 NOTE — Progress Notes (Signed)
 Your thyroid  function test is normal.  Kidney function is stable.

## 2023-12-29 NOTE — Telephone Encounter (Signed)
 Please go ahead and arrange for AF ablation only. We decided to continue anticoagulation and cancel Watchman for now

## 2023-12-31 NOTE — Telephone Encounter (Signed)
 Spoke with patient. Explained only proceeding with AF ablation at this time. Cancelled CT scan. Explained to patient that someone will be in contact to arrange AF ablation. They will give him instructions if repeat labs will be needed. He had labs drawn 12/5. Explained if procedure is > 30 days then would need repeat labs prior which can be done at [facility].   Explained Dr. Ladona reviewed his recent labs and said his thyroid  function is normal and kidney function is stable.   Patient grateful for the call. He will await to hear from the office to schedule ablation.

## 2024-01-01 ENCOUNTER — Encounter: Payer: Self-pay | Admitting: Pharmacist

## 2024-01-01 ENCOUNTER — Other Ambulatory Visit (HOSPITAL_COMMUNITY): Payer: Self-pay

## 2024-01-01 ENCOUNTER — Other Ambulatory Visit: Payer: Self-pay

## 2024-01-04 NOTE — Telephone Encounter (Signed)
 Pt scheduled for Afib Ablation with Dr. Almetta on 12/31 at 930 am. Labs completed on 12/5....  Instruction letter sent via MyChart.

## 2024-01-21 ENCOUNTER — Ambulatory Visit (HOSPITAL_COMMUNITY)

## 2024-01-22 NOTE — Pre-Procedure Instructions (Signed)
 Instructed patient on the following items: Arrival time 0730 Nothing to eat or drink after midnight No meds AM of procedure Responsible person to drive you home and stay with you for 24 hrs  Have you missed any doses of anti-coagulant Xarelto - takes once a day, hasn't missed any doses in last 4 weeks.

## 2024-01-23 ENCOUNTER — Ambulatory Visit (HOSPITAL_COMMUNITY)
Admission: RE | Disposition: A | Discharge status: Home / Self Care | Payer: Self-pay | Source: Home / Self Care | Attending: Student in an Organized Health Care Education/Training Program

## 2024-01-23 ENCOUNTER — Ambulatory Visit (HOSPITAL_COMMUNITY)
Admission: RE | Admit: 2024-01-23 | Discharge: 2024-01-23 | Disposition: A | Discharge status: Home / Self Care | Source: Home / Self Care | Attending: Student in an Organized Health Care Education/Training Program | Admitting: Student in an Organized Health Care Education/Training Program

## 2024-01-23 ENCOUNTER — Ambulatory Visit (HOSPITAL_COMMUNITY): Admitting: Anesthesiology

## 2024-01-23 ENCOUNTER — Other Ambulatory Visit: Payer: Self-pay

## 2024-01-23 DIAGNOSIS — I10 Essential (primary) hypertension: Secondary | ICD-10-CM

## 2024-01-23 DIAGNOSIS — Z79899 Other long term (current) drug therapy: Secondary | ICD-10-CM | POA: Insufficient documentation

## 2024-01-23 DIAGNOSIS — Z7901 Long term (current) use of anticoagulants: Secondary | ICD-10-CM | POA: Diagnosis not present

## 2024-01-23 DIAGNOSIS — J449 Chronic obstructive pulmonary disease, unspecified: Secondary | ICD-10-CM | POA: Diagnosis not present

## 2024-01-23 DIAGNOSIS — I4819 Other persistent atrial fibrillation: Secondary | ICD-10-CM

## 2024-01-23 DIAGNOSIS — I428 Other cardiomyopathies: Secondary | ICD-10-CM | POA: Diagnosis not present

## 2024-01-23 DIAGNOSIS — G4730 Sleep apnea, unspecified: Secondary | ICD-10-CM | POA: Diagnosis not present

## 2024-01-23 DIAGNOSIS — I4891 Unspecified atrial fibrillation: Secondary | ICD-10-CM | POA: Diagnosis not present

## 2024-01-23 DIAGNOSIS — G4733 Obstructive sleep apnea (adult) (pediatric): Secondary | ICD-10-CM | POA: Insufficient documentation

## 2024-01-23 DIAGNOSIS — K219 Gastro-esophageal reflux disease without esophagitis: Secondary | ICD-10-CM | POA: Diagnosis not present

## 2024-01-23 DIAGNOSIS — Z8249 Family history of ischemic heart disease and other diseases of the circulatory system: Secondary | ICD-10-CM | POA: Insufficient documentation

## 2024-01-23 HISTORY — PX: ATRIAL FIBRILLATION ABLATION: EP1191

## 2024-01-23 LAB — POCT ACTIVATED CLOTTING TIME
Activated Clotting Time: 307 s
Activated Clotting Time: 342 s

## 2024-01-23 MED ORDER — ALBUTEROL SULFATE HFA 108 (90 BASE) MCG/ACT IN AERS
INHALATION_SPRAY | RESPIRATORY_TRACT | Status: DC | PRN
  Administered 2024-01-23 (×2): 2 via RESPIRATORY_TRACT

## 2024-01-23 MED ORDER — HEPARIN (PORCINE) IN NACL 1000-0.9 UT/500ML-% IV SOLN
INTRAVENOUS | Status: DC | PRN
  Administered 2024-01-23 (×2): 500 mL

## 2024-01-23 MED ORDER — FENTANYL CITRATE (PF) 250 MCG/5ML IJ SOLN
INTRAMUSCULAR | Status: DC | PRN
  Administered 2024-01-23: 100 ug via INTRAVENOUS

## 2024-01-23 MED ORDER — LIDOCAINE 2% (20 MG/ML) 5 ML SYRINGE
INTRAMUSCULAR | Status: DC | PRN
  Administered 2024-01-23: 80 mg via INTRAVENOUS

## 2024-01-23 MED ORDER — FENTANYL CITRATE (PF) 100 MCG/2ML IJ SOLN: 25.0000 ug | INTRAMUSCULAR | Status: DC | PRN

## 2024-01-23 MED ORDER — SUGAMMADEX SODIUM 200 MG/2ML IV SOLN
INTRAVENOUS | Status: DC | PRN
  Administered 2024-01-23: 200 mg via INTRAVENOUS

## 2024-01-23 MED ORDER — ONDANSETRON HCL 4 MG/2ML IJ SOLN
INTRAMUSCULAR | Status: DC | PRN
  Administered 2024-01-23: 4 mg via INTRAVENOUS

## 2024-01-23 MED ORDER — FENTANYL CITRATE (PF) 100 MCG/2ML IJ SOLN
INTRAMUSCULAR | Status: AC
  Filled 2024-01-23: qty 2

## 2024-01-23 MED ORDER — HEPARIN SODIUM (PORCINE) 1000 UNIT/ML IJ SOLN
INTRAMUSCULAR | Status: DC | PRN
  Administered 2024-01-23: 1000 [IU] via INTRAVENOUS

## 2024-01-23 MED ORDER — PHENYLEPHRINE HCL-NACL 20-0.9 MG/250ML-% IV SOLN
INTRAVENOUS | Status: DC | PRN
  Administered 2024-01-23: 15 ug/min via INTRAVENOUS

## 2024-01-23 MED ORDER — HEPARIN SODIUM (PORCINE) 1000 UNIT/ML IJ SOLN
INTRAMUSCULAR | Status: DC | PRN
  Administered 2024-01-23: 5000 [IU] via INTRAVENOUS
  Administered 2024-01-23: 19000 [IU] via INTRAVENOUS

## 2024-01-23 MED ORDER — SODIUM CHLORIDE 0.9% FLUSH: 3.0000 mL | INTRAVENOUS | Status: DC | PRN

## 2024-01-23 MED ORDER — DEXAMETHASONE SOD PHOSPHATE PF 10 MG/ML IJ SOLN
INTRAMUSCULAR | Status: DC | PRN
  Administered 2024-01-23: 10 mg via INTRAVENOUS

## 2024-01-23 MED ORDER — PROTAMINE SULFATE 10 MG/ML IV SOLN
INTRAVENOUS | Status: DC | PRN
  Administered 2024-01-23: 40 mg via INTRAVENOUS

## 2024-01-23 MED ORDER — IPRATROPIUM-ALBUTEROL 0.5-2.5 (3) MG/3ML IN SOLN
RESPIRATORY_TRACT | Status: AC
  Administered 2024-01-23: 3 mL via RESPIRATORY_TRACT
  Filled 2024-01-23: qty 3

## 2024-01-23 MED ORDER — MIDAZOLAM HCL 2 MG/2ML IJ SOLN
INTRAMUSCULAR | Status: AC
  Filled 2024-01-23: qty 2

## 2024-01-23 MED ORDER — SODIUM CHLORIDE 0.9 % IV SOLN: INTRAVENOUS | Status: DC

## 2024-01-23 MED ORDER — HYDRALAZINE HCL 20 MG/ML IJ SOLN
INTRAMUSCULAR | Status: AC
  Filled 2024-01-23: qty 1

## 2024-01-23 MED ORDER — PROPOFOL 10 MG/ML IV BOLUS
INTRAVENOUS | Status: DC | PRN
  Administered 2024-01-23: 180 mg via INTRAVENOUS
  Administered 2024-01-23: 40 mg via INTRAVENOUS

## 2024-01-23 MED ORDER — MIDAZOLAM HCL (PF) 2 MG/2ML IJ SOLN
INTRAMUSCULAR | Status: DC | PRN
  Administered 2024-01-23: 2 mg via INTRAVENOUS

## 2024-01-23 MED ORDER — CEFAZOLIN SODIUM-DEXTROSE 2-4 GM/100ML-% IV SOLN
INTRAVENOUS | Status: AC
  Filled 2024-01-23: qty 100

## 2024-01-23 MED ORDER — ONDANSETRON HCL 4 MG/2ML IJ SOLN: 4.0000 mg | Freq: Once | INTRAMUSCULAR | Status: DC | PRN

## 2024-01-23 MED ORDER — ALBUMIN HUMAN 5 % IV SOLN: INTRAVENOUS | Status: DC | PRN

## 2024-01-23 MED ORDER — CEFAZOLIN SODIUM-DEXTROSE 2-3 GM-%(50ML) IV SOLR
INTRAVENOUS | Status: DC | PRN
  Administered 2024-01-23: 2 g via INTRAVENOUS

## 2024-01-23 MED ORDER — IPRATROPIUM-ALBUTEROL 0.5-2.5 (3) MG/3ML IN SOLN: 3.0000 mL | Freq: Once | RESPIRATORY_TRACT | Status: AC

## 2024-01-23 MED ORDER — HEPARIN SODIUM (PORCINE) 1000 UNIT/ML IJ SOLN
INTRAMUSCULAR | Status: AC
  Filled 2024-01-23: qty 10

## 2024-01-23 MED ORDER — HYDRALAZINE HCL 20 MG/ML IJ SOLN
10.0000 mg | Freq: Once | INTRAMUSCULAR | Status: AC
  Administered 2024-01-23: 10 mg via INTRAVENOUS

## 2024-01-23 MED ORDER — ACETAMINOPHEN 500 MG PO TABS
1000.0000 mg | ORAL_TABLET | Freq: Once | ORAL | Status: AC
  Administered 2024-01-23: 1000 mg via ORAL
  Filled 2024-01-23: qty 2

## 2024-01-23 MED ORDER — ROCURONIUM BROMIDE 10 MG/ML (PF) SYRINGE
PREFILLED_SYRINGE | INTRAVENOUS | Status: DC | PRN
  Administered 2024-01-23: 50 mg via INTRAVENOUS
  Administered 2024-01-23: 20 mg via INTRAVENOUS

## 2024-01-23 MED ORDER — SODIUM CHLORIDE 0.9 % IV SOLN: 250.0000 mL | INTRAVENOUS | Status: DC | PRN

## 2024-01-23 MED ORDER — SODIUM CHLORIDE 0.9% FLUSH: 3.0000 mL | Freq: Two times a day (BID) | INTRAVENOUS | Status: DC
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
## 2024-01-23 NOTE — Discharge Instructions (Signed)

## 2024-01-23 NOTE — Anesthesia Postprocedure Evaluation (Signed)
"   Anesthesia Post Note  Patient: [name] [name]  Procedure(s) Performed: ATRIAL FIBRILLATION ABLATION     Patient location during evaluation: PACU Anesthesia Type: General Level of consciousness: awake and alert Pain management: pain level controlled Vital Signs Assessment: post-procedure vital signs reviewed and stable Respiratory status: spontaneous breathing, nonlabored ventilation and respiratory function stable Cardiovascular status: blood pressure returned to baseline and stable Postop Assessment: no apparent nausea or vomiting Anesthetic complications: no   There were no known notable events for this encounter.  Last Vitals:  Vitals:   01/23/24 1400 01/23/24 1430  BP: (!) 151/81 137/79  Pulse: 70 70  Resp: 20 17  Temp:    SpO2: 90% 91%    Last Pain:  Vitals:   01/23/24 0755  TempSrc: Oral  PainSc: 0-No pain                 Garnette FORBES Skillern      "

## 2024-01-23 NOTE — Anesthesia Procedure Notes (Signed)
 Procedure Name: Intubation Date/Time: 01/23/2024 10:09 AM  Performed by: Worth Peppers, CRNAPre-anesthesia Checklist: Patient identified, Emergency Drugs available, Suction available and Patient being monitored Patient Re-evaluated:Patient Re-evaluated prior to induction Oxygen Delivery Method: Circle System Utilized Preoxygenation: Pre-oxygenation with 100% oxygen Induction Type: IV induction Ventilation: Mask ventilation without difficulty Laryngoscope Size: Mac and 3 Grade View: Grade I Tube type: Oral Number of attempts: 1 Airway Equipment and Method: Stylet and Oral airway Placement Confirmation: ETT inserted through vocal cords under direct vision, positive ETCO2 and breath sounds checked- equal and bilateral Secured at: 23 cm Tube secured with: Tape Dental Injury: Teeth and Oropharynx as per pre-operative assessment

## 2024-01-23 NOTE — Progress Notes (Signed)
 Patient noted to have minimal amount of oozing after ambulation, MD Almetta raker to make aware, patient on bedrest another 30 min, MD Almetta to bedside with dermabond, states patient is able to ambulate one glue as dried. Glue dried patient ambulated without any bleeding per MD okay to d/c, confirmed when to restart Xarelto .Patient and patient wife given discharge instructions, education provided no further questions at this time. Patient able to ambulate and void before discharge. Able to tolerate PO intake. Patient site is clean, dry, intact with no hematoma noted upon discharge.

## 2024-01-23 NOTE — Transfer of Care (Signed)
 Immediate Anesthesia Transfer of Care Note  Patient: [name] [name]  Procedure(s) Performed: ATRIAL FIBRILLATION ABLATION  Patient Location: PACU  Anesthesia Type:MAC and General  Level of Consciousness: awake and sedated  Airway & Oxygen Therapy: Patient Spontanous Breathing and Patient connected to face mask oxygen  Post-op Assessment: Post -op Vital signs reviewed and stable and Post -op Vital signs reviewed and unstable, Anesthesiologist notified  Post vital signs: Reviewed and stable  Last Vitals:  Vitals Value Taken Time  BP    Temp    Pulse    Resp    SpO2      Last Pain:  Vitals:   01/23/24 0755  TempSrc: Oral  PainSc: 0-No pain         Complications: There were no known notable events for this encounter.

## 2024-01-23 NOTE — Interval H&P Note (Signed)
 History and Physical Interval Note:  01/23/2024 9:46 AM  [name] [name]  has presented today for surgery, with the diagnosis of afib.  The various methods of treatment have been discussed with the patient and family. After consideration of risks, benefits and other options for treatment, the patient has consented to  Procedures: ATRIAL FIBRILLATION ABLATION (N/A) as a surgical intervention.  The patient's history has been reviewed, patient examined, no change in status, stable for surgery.  I have reviewed the patient's chart and labs.  Questions were answered to the patient's satisfaction.     [name] [name]

## 2024-01-23 NOTE — Anesthesia Preprocedure Evaluation (Addendum)
"                                    Anesthesia Evaluation  Patient identified by MRN, date of birth, ID band Patient awake    Reviewed: Allergy & Precautions, NPO status , Patient's Chart, lab work & pertinent test results, reviewed documented beta blocker date and time   Airway Mallampati: III  TM Distance: >3 FB Neck ROM: Full    Dental  (+) Dental Advisory Given, Upper Dentures   Pulmonary sleep apnea and Continuous Positive Airway Pressure Ventilation , COPD,  COPD inhaler, Current SmokerPatient did not abstain from smoking.   Pulmonary exam normal breath sounds clear to auscultation       Cardiovascular hypertension, Pt. on medications and Pt. on home beta blockers (-) angina (-) Past MI Normal cardiovascular exam+ dysrhythmias Atrial Fibrillation  Rhythm:Regular Rate:Normal  Echo 07/03/23: 1. Left ventricular ejection fraction, by estimation, is 40 to 45%. The  left ventricle has mildly decreased function. The left ventricle  demonstrates global hypokinesis. The left ventricular internal cavity size  was upper limit of normal. There is mild  left ventricular hypertrophy. Left ventricular diastolic parameters are  consistent with Grade I diastolic dysfunction (impaired relaxation).   2. Right ventricular systolic function is mildly reduced. The right  ventricular size is normal. There is normal pulmonary artery systolic  pressure.   3. Left atrial size was moderately dilated.   4. Right atrial size was mildly dilated.   5. The mitral valve is normal in structure. Mild mitral valve  regurgitation. No evidence of mitral stenosis.   6. The aortic valve is grossly normal. Aortic valve regurgitation is not  visualized. No aortic stenosis is present.   7. Aortic dilatation noted. There is dilatation of the ascending aorta,  measuring 39 mm.   8. The inferior vena cava is normal in size with greater than 50%  respiratory variability, suggesting right atrial pressure  of 3 mmHg.     Neuro/Psych  Headaches PSYCHIATRIC DISORDERS Anxiety Depression       GI/Hepatic Neg liver ROS,GERD  Medicated,,  Endo/Other  Obesity   Renal/GU negative Renal ROS     Musculoskeletal  (+) Arthritis ,    Abdominal   Peds  Hematology  (+) Blood dyscrasia (Xarelto )   Anesthesia Other Findings Day of surgery medications reviewed with the patient.  Reproductive/Obstetrics                              Anesthesia Physical Anesthesia Plan  ASA: 4  Anesthesia Plan: General   Post-op Pain Management: Tylenol  PO (pre-op)*   Induction: Intravenous  PONV Risk Score and Plan: 1 and Dexamethasone  and Ondansetron   Airway Management Planned: Oral ETT  Additional Equipment:   Intra-op Plan:   Post-operative Plan: Extubation in OR  Informed Consent: I have reviewed the patients History and Physical, chart, labs and discussed the procedure including the risks, benefits and alternatives for the proposed anesthesia with the patient or authorized representative who has indicated his/her understanding and acceptance.     Dental advisory given  Plan Discussed with: CRNA  Anesthesia Plan Comments:          Anesthesia Quick Evaluation  "

## 2024-01-24 NOTE — Op Note (Signed)
" ° °  Procedure:  Intracardiac catheter ablation AFIB including Transseptal Cath (CPT 93656)  Pre-Op Diagnosis: persistent AF  Post-Op Diagnosis: same   Procedure Date:  01/23/24   Attending: Adina Primus, MD   Anesthesia: general anesthesia   Initial Intervals: NSR, PR 173, QRS 96, QT 438, RR 1310   Procedure: The patient entered the EP lab in a fasting nonsedated state. The procedural time-out was achieved. The patient was placed under general anesthesia by Anesthesiology. Once the patient was draped and prepped in normal fashion with multiple layers of Hibiclens  scrub in the bilateral groins, access to the veins was achieved under ultrasound guidance using the modified Seldinger technique. Following access both sites were pre closed with Perclose ProStyle suture mediated closure.    Access/sheath: - RFV: 8 Fr short sheath->8.5 Fr Sm curl Vizigo - RFV: 9 Fr short sheath   Catheters: - 8.5 Fr SoundStar ICE - Varipulse PFA catheter - OctaRay 3-3-3-3-3 mapping catheter  - Webster CS D/F decapolar catheter  Transseptal Access Systemic heparinization was given to maintain ACT's >350. Transseptal puncture was performed with the VersaCross wire through the Vizigo sheath using ICE and fluoroscopy. The sheath was carefully aspirated and flushed.      Mapping: Next, a deflectable OctaRay was advanced into the left atrium through the Vizigo sheath.  A 3-dimensional electroanatomic map was constructed of the left atrium and pulmonary veins using the Carto mapping system. The posterior wall had normal voltage.    Ablation: The OctaRay was removed from the Vizigo sheath and the Carto Varipulse PFA catheter was advanced to the LSPV os. The PFA catheter was advanced to each of the 4 pulmonary veins and at least 4 PFA applications were applied per vein. Additional lesions were applied across both carinas and the RPV septum. Following initial ablation, repeat mapping with the Varipulse catheter  confirmed first pass isolation in the RPVs and isolation of the LPVs however there was insufficient LA ridge coverage. Additional ablation was applied along the left atrial ridge resulting in a wider anterior antral LPV isolation.    Heparinization was then reversed with protamine. Catheters and sheaths were pulled and hemostasis obtained with Perclose ProStyle sutures. No complications were evident. Final ICE assessment without pericardial effusion. The patient was transported to post procedure holding.   Final intervals: NSR, PR 152, QRS 93, QT 489, RR 1275  Summary: NSR on arrival   Successful transseptal puncture x 1 with ICE guidance  3-D mapping of the LA and PV's   Successful PV isolation ([facility]) using J&J Varipulse PFA    Recommendations: Bedrest x 2 hours  Anti-coagulation: Resume rivaroxaban  20 mg tonight Anti-platelet: none  Anti-arrhythmic: d/c OP amiodarone  today  Rate control: continue OP toprol  XL 50 mg at bedtime  EP f/u to be scheduled   [name] DELENA Primus, MD [facility]  Cardiac Electrophysiology  "

## 2024-01-25 ENCOUNTER — Encounter (HOSPITAL_COMMUNITY): Payer: Self-pay | Admitting: Student in an Organized Health Care Education/Training Program

## 2024-01-28 MED FILL — Cefazolin Sodium-Dextrose IV Solution 2 GM/100ML-4%: INTRAVENOUS | Qty: 100 | Status: AC

## 2024-01-28 MED FILL — Fentanyl Citrate Preservative Free (PF) Inj 100 MCG/2ML: INTRAMUSCULAR | Qty: 2 | Status: AC

## 2024-01-30 ENCOUNTER — Ambulatory Visit (HOSPITAL_COMMUNITY)
Admission: RE | Admit: 2024-01-30 | Discharge: 2024-01-30 | Disposition: A | Discharge status: Home / Self Care | Source: Ambulatory Visit | Attending: Internal Medicine | Admitting: Internal Medicine

## 2024-01-30 ENCOUNTER — Ambulatory Visit: Payer: Self-pay | Admitting: Cardiology

## 2024-01-30 DIAGNOSIS — R0609 Other forms of dyspnea: Secondary | ICD-10-CM | POA: Diagnosis present

## 2024-01-30 DIAGNOSIS — I420 Dilated cardiomyopathy: Secondary | ICD-10-CM | POA: Insufficient documentation

## 2024-01-30 LAB — ECHOCARDIOGRAM COMPLETE
Area-P 1/2: 2.68 cm2
S' Lateral: 3.8 cm

## 2024-01-30 NOTE — Progress Notes (Signed)
 Compared to 07/11/2022, LVEF has improved from 40 to 45% and right ventricular systolic function is also improved from mildly reduced EF.

## 2024-02-20 ENCOUNTER — Encounter (HOSPITAL_COMMUNITY): Payer: Self-pay | Admitting: Internal Medicine

## 2024-02-20 ENCOUNTER — Ambulatory Visit (HOSPITAL_COMMUNITY)
Admission: RE | Admit: 2024-02-20 | Discharge: 2024-02-20 | Disposition: A | Discharge status: Home / Self Care | Source: Ambulatory Visit | Attending: Internal Medicine | Admitting: Internal Medicine

## 2024-02-20 VITALS — BP 136/90 | HR 53 | Ht 69.0 in | Wt 212.8 lb

## 2024-02-20 DIAGNOSIS — I428 Other cardiomyopathies: Secondary | ICD-10-CM

## 2024-02-20 DIAGNOSIS — D6869 Other thrombophilia: Secondary | ICD-10-CM

## 2024-02-20 DIAGNOSIS — I4819 Other persistent atrial fibrillation: Secondary | ICD-10-CM | POA: Diagnosis not present

## 2024-02-20 MED ORDER — METOPROLOL SUCCINATE ER 50 MG PO TB24: 25.0000 mg | ORAL_TABLET | Freq: Every evening | ORAL | Status: AC

## 2024-02-20 NOTE — Patient Instructions (Signed)
Decrease metoprolol to 25 mg once a day.

## 2024-02-20 NOTE — Progress Notes (Signed)
 "   Primary Care Physician: Maree Isles, MD Primary Cardiologist: Gordy Bergamo, MD Electrophysiologist: Donnice DELENA Primus, MD     Referring Physician: Vicci Rollo SAUNDERS, PA-C     [name] [name] is a 64 y.o. male with a history of HTN, OSA on BiPAP, COPD, heavy tobacco use, NICM, and atrial fibrillation who presents for consultation in the [facility]. S/p DCCV on 8/28 with ERAF. Patient is on Xarelto  for a CHADS2VASC score of 2.  On follow up 11/20/23, patient is currently in NSR. He began amiodarone  at last office visit for rhythm control and is scheduled to speak with Dr. Primus regarding ablation. No missed doses of Xarelto . He is happy to be back in normal rhythm.  On follow up 02/20/2024, patient is currently in NSR. S/p Afib ablation on 01/23/2024 by Dr. Primus.  Patient's amiodarone  was stopped after ablation by Dr. Primus.  No episodes of Afib since ablation. No chest pain or SOB. Leg sites healed without issue. No missed doses of anticoagulant. He notes to be very tired.   Today, he denies symptoms of orthopnea, PND, lower extremity edema, dizziness, presyncope, syncope, snoring, daytime somnolence, bleeding, or neurologic sequela. The patient is tolerating medications without difficulties and is otherwise without complaint today.     Atrial Fibrillation Risk Factors:  he does have symptoms or diagnosis of sleep apnea. he is compliant with CPAP therapy.   he has a BMI of Body mass index is 31.43 kg/m.SABRA Filed Weights   02/20/24 1122  Weight: 96.5 kg      Current Outpatient Medications  Medication Sig Dispense Refill   albuterol  (VENTOLIN  HFA) 108 (90 Base) MCG/ACT inhaler Inhale 2 puffs into the lungs every 4 (four) hours as needed for shortness of breath.     Aromatic Inhalants (VICKS VAPOR INHALER IN) Inhale 1 Inhalation into the lungs daily as needed (Congestion). Inhaler stick     atorvastatin  (LIPITOR) 40 MG tablet Take 1 tablet  (40 mg total) by mouth daily. (Patient taking differently: Take 40 mg by mouth at bedtime.) 90 tablet 3   busPIRone (BUSPAR) 10 MG tablet Take 10 mg by mouth 3 (three) times daily.     gabapentin  (NEURONTIN ) 300 MG capsule Take 300 mg by mouth 3 (three) times daily.     Multiple Vitamin (MULTIVITAMIN) capsule Take 1 capsule by mouth daily. One a day 50+     omeprazole (PRILOSEC) 20 MG capsule Take 20 mg by mouth daily.     oxyCODONE -acetaminophen  (PERCOCET) 7.5-325 MG tablet Take 1 tablet by mouth 2 (two) times daily.     polyethylene glycol (MIRALAX  / GLYCOLAX ) 17 g packet Take 34 g by mouth daily as needed for moderate constipation or severe constipation.     rivaroxaban  (XARELTO ) 20 MG TABS tablet TAKE 1 TABLET BY MOUTH ONCE DAILY WITH SUPPER 30 tablet 5   sacubitril -valsartan  (ENTRESTO ) 24-26 MG Take 1 tablet by mouth 2 (two) times daily. 180 tablet 3   sodium chloride  (OCEAN) 0.65 % SOLN nasal spray Place 1 spray into both nostrils daily as needed for congestion.     tiZANidine  (ZANAFLEX ) 4 MG tablet Take 4 mg by mouth 3 (three) times daily.     metoprolol  succinate (TOPROL -XL) 50 MG 24 hr tablet Take 0.5 tablets (25 mg total) by mouth every evening.     No current facility-administered medications for this encounter.    Atrial Fibrillation Management history:  Previous antiarrhythmic drugs: amiodarone  Previous cardioversions: 09/20/23 Previous ablations: 01/23/2024  Anticoagulation history: Xarelto    ROS- All systems are reviewed and negative except as per the HPI above.  Physical Exam: BP (!) 136/90   Pulse (!) 53   Ht 5' 9 (1.753 m)   Wt 96.5 kg   BMI 31.43 kg/m   GEN- The patient is well appearing, alert and oriented x 3 today.   Neck - no JVD or carotid bruit noted Lungs- Clear to ausculation bilaterally, normal work of breathing Heart- Regular bradycardic rate and rhythm, no murmurs, rubs or gallops, PMI not laterally displaced Extremities- no clubbing, cyanosis, or  edema Skin - no rash or ecchymosis noted   EKG today demonstrates  EKG Interpretation Date/Time:  Wednesday February 20 2024 11:24:19 EST Ventricular Rate:  53 PR Interval:  156 QRS Duration:  96 QT Interval:  436 QTC Calculation: 409 R Axis:   43  Text Interpretation: Sinus bradycardia ST & T wave abnormality, consider anterolateral ischemia Abnormal ECG When compared with ECG of 23-Jan-2024 13:14, No significant change was found Confirmed by Terra Pac ([id number]) on 02/20/2024 11:26:39 AM    Echo 07/03/23 demonstrated  1. Left ventricular ejection fraction, by estimation, is 40 to 45%. The  left ventricle has mildly decreased function. The left ventricle  demonstrates global hypokinesis. The left ventricular internal cavity size  was upper limit of normal. There is mild  left ventricular hypertrophy. Left ventricular diastolic parameters are  consistent with Grade I diastolic dysfunction (impaired relaxation).   2. Right ventricular systolic function is mildly reduced. The right  ventricular size is normal. There is normal pulmonary artery systolic  pressure.   3. Left atrial size was moderately dilated.   4. Right atrial size was mildly dilated.   5. The mitral valve is normal in structure. Mild mitral valve  regurgitation. No evidence of mitral stenosis.   6. The aortic valve is grossly normal. Aortic valve regurgitation is not  visualized. No aortic stenosis is present.   7. Aortic dilatation noted. There is dilatation of the ascending aorta,  measuring 39 mm.   8. The inferior vena cava is normal in size with greater than 50%  respiratory variability, suggesting right atrial pressure of 3 mmHg.   ASSESSMENT & PLAN CHA2DS2-VASc Score = 2  The patient's score is based upon: CHF History: 1 HTN History: 1 Diabetes History: 0 Stroke History: 0 Vascular Disease History: 0 Age Score: 0 Gender Score: 0       ASSESSMENT AND PLAN: Persistent Atrial Fibrillation (ICD10:   I48.19) The patient's CHA2DS2-VASc score is 2, indicating a 2.2% annual risk of stroke.   S/p A-fib ablation on 01/23/2024 by Dr. Almetta.  Patient is currently in NSR.  Amiodarone  was discontinued by Dr. Almetta after ablation.  We discussed what to expect during the recovery period following ablation. Due to patient noting fatigue, will decrease Toprol  to 25 mg daily.   Secondary Hypercoagulable State (ICD10:  D68.69) The patient is at significant risk for stroke/thromboembolism based upon his CHA2DS2-VASc Score of 2.  Continue Rivaroxaban  (Xarelto ).  Continue Xarelto  20 mg daily without interruption in the blanking period.    Follow up with EP as scheduled.   Terra Pac, [facility]  [facility] [address] [place], [place] [address] [phone]   "

## 2024-03-31 ENCOUNTER — Ambulatory Visit: Admitting: Cardiology

## 2024-04-21 ENCOUNTER — Ambulatory Visit: Admitting: Physician Assistant
